# Patient Record
Sex: Female | Born: 1985 | Race: White | Hispanic: No | Marital: Married | Smoking: Never smoker
Health system: Southern US, Community
[De-identification: ages and names within clinical notes are randomized; demographics above are authoritative.]

## PROBLEM LIST (undated history)

## (undated) ENCOUNTER — Inpatient Hospital Stay (HOSPITAL_COMMUNITY): Payer: Self-pay

## (undated) DIAGNOSIS — J45909 Unspecified asthma, uncomplicated: Secondary | ICD-10-CM

## (undated) DIAGNOSIS — N83209 Unspecified ovarian cyst, unspecified side: Secondary | ICD-10-CM

## (undated) DIAGNOSIS — M25572 Pain in left ankle and joints of left foot: Secondary | ICD-10-CM

## (undated) DIAGNOSIS — G8929 Other chronic pain: Secondary | ICD-10-CM

## (undated) HISTORY — DX: Unspecified asthma, uncomplicated: J45.909

## (undated) HISTORY — PX: FIBULA FRACTURE SURGERY: SHX947

## (undated) HISTORY — PX: TIBIA FRACTURE SURGERY: SHX806

## (undated) HISTORY — PX: OTHER SURGICAL HISTORY: SHX169

## (undated) HISTORY — PX: KNEE SURGERY: SHX244

---

## 2012-02-16 ENCOUNTER — Encounter (HOSPITAL_BASED_OUTPATIENT_CLINIC_OR_DEPARTMENT_OTHER): Payer: Self-pay | Admitting: *Deleted

## 2012-02-16 ENCOUNTER — Inpatient Hospital Stay (HOSPITAL_BASED_OUTPATIENT_CLINIC_OR_DEPARTMENT_OTHER)
Admission: EM | Admit: 2012-02-16 | Discharge: 2012-02-17 | Disposition: A | Discharge status: Home / Self Care | Payer: BC Managed Care – PPO | Attending: Obstetrics & Gynecology | Admitting: Obstetrics & Gynecology

## 2012-02-16 DIAGNOSIS — N831 Corpus luteum cyst of ovary, unspecified side: Secondary | ICD-10-CM | POA: Insufficient documentation

## 2012-02-16 DIAGNOSIS — O00109 Unspecified tubal pregnancy without intrauterine pregnancy: Secondary | ICD-10-CM | POA: Insufficient documentation

## 2012-02-16 DIAGNOSIS — N838 Other noninflammatory disorders of ovary, fallopian tube and broad ligament: Secondary | ICD-10-CM | POA: Insufficient documentation

## 2012-02-16 DIAGNOSIS — Z3490 Encounter for supervision of normal pregnancy, unspecified, unspecified trimester: Secondary | ICD-10-CM

## 2012-02-16 DIAGNOSIS — R109 Unspecified abdominal pain: Secondary | ICD-10-CM | POA: Insufficient documentation

## 2012-02-16 DIAGNOSIS — O34599 Maternal care for other abnormalities of gravid uterus, unspecified trimester: Secondary | ICD-10-CM | POA: Insufficient documentation

## 2012-02-16 HISTORY — DX: Unspecified ovarian cyst, unspecified side: N83.209

## 2012-02-16 MED ORDER — ONDANSETRON HCL 4 MG/2ML IJ SOLN
4.0000 mg | Freq: Once | INTRAMUSCULAR | Status: AC
  Administered 2012-02-17: 4 mg via INTRAVENOUS
  Filled 2012-02-16: qty 2

## 2012-02-16 MED ORDER — FENTANYL CITRATE 0.05 MG/ML IJ SOLN
100.0000 ug | Freq: Once | INTRAMUSCULAR | Status: AC
  Administered 2012-02-17: 100 ug via INTRAVENOUS
  Filled 2012-02-16: qty 2

## 2012-02-16 NOTE — ED Provider Notes (Signed)
History  This chart was scribed for Janice Gaskins, MD by Shari Heritage, ED Scribe. The patient was seen in room MH05/MH05. Patient's care was started at 2341.  CSN: [id number]  Arrival date & time 02/16/12  2339   First MD Initiated Contact with Patient 02/16/12 2341      Chief Complaint  Patient presents with  . Abdominal Pain     Patient is a 27 y.o. female presenting with abdominal pain. The history is provided by the patient. No language interpreter was used.  Abdominal Pain The primary symptoms of the illness include abdominal pain and vomiting. The primary symptoms of the illness do not include fever, nausea, diarrhea or vaginal bleeding. The current episode started 1 to 2 hours ago. The onset of the illness was sudden. The problem has not changed since onset. The abdominal pain began 1 to 2 hours ago. The pain came on suddenly. The abdominal pain has been unchanged since its onset. The abdominal pain is located in the suprapubic region. The abdominal pain does not radiate. The abdominal pain is relieved by nothing. The abdominal pain is exacerbated by movement (certain movements).  The vomiting began today. Vomiting occurs 2 to 5 times per day. The emesis contains stomach contents.    HPI Comments: Janice Cooper is a 27 y.o. female who presents to the Emergency Department complaining of sudden onset, severe, constant, suprapubic abdominal pain that began 1-2 hours ago. Pain is non-radiating. Patient states that she was resting in bed when pain began. Associated symtpom includes 5 episodes of emesis. Patient says that pain worsened while driving over bumps in the road during ambulance ride to the ED. Patient denies nausea, diarrhea, fever or vaginal bleeding. Patient has a history of ovarian cyst rupture, but states that pain is not the same as current pain. She denies any other significant past medical or surgical abdominal history. Patient has no known allergies to pain  medicines.  Past Medical History  Diagnosis Date  . Ovarian cyst   . Ovarian cyst rupture     Past Surgical History  Procedure Date  . Knee surgery   . Tibia fracture surgery   . Fibula fracture surgery   . Thumb surgery     No family history on file.  History  Substance Use Topics  . Smoking status: Not on file  . Smokeless tobacco: Not on file  . Alcohol Use: Not on file    OB History    No data available      Review of Systems  Constitutional: Negative for fever.  Gastrointestinal: Positive for vomiting and abdominal pain. Negative for nausea and diarrhea.  Genitourinary: Negative for vaginal bleeding.  All other systems reviewed and are negative.    Allergies  Review of patient's allergies indicates not on file.  Home Medications  No current outpatient prescriptions on file.  Triage Vitals: BP 112/69  Pulse 89  Temp 98.6 F (37 C) (Oral)  Resp 22  Wt 175 lb (79.379 kg)  SpO2 99%  LMP 01/17/2012 BP 117/77  Pulse 89  Temp 98.6 F (37 C) (Oral)  Resp 18  Wt 175 lb (79.379 kg)  SpO2 99%  LMP 01/17/2012  Physical Exam CONSTITUTIONAL: Well developed/well nourished, ill appearing. HEAD AND FACE: Normocephalic/atraumatic EYES: EOMI/PERRL ENMT: Mucous membranes dry. NECK: supple no meningeal signs SPINE:entire spine nontender CV: S1/S2 noted, no murmurs/rubs/gallops noted LUNGS: Lungs are clear to auscultation bilaterally, no apparent distress ABDOMEN: soft, no rebound or guarding, diffuse tenderness with  localizing in suprapubic region, tenderness is severe. GU:no cva tenderness No cmt.  No vag bleeding.  Diffuse adnexal tenderness noted.  Chaperone present NEURO: Pt is awake/alert, moves all extremitiesx4 EXTREMITIES: pulses normal, full ROM SKIN: warm, color normal PSYCH: no abnormalities of mood noted  ED Course  Procedures  FAST BEDSIDE US Indication: abdominal pain, suspected ruptured ectopic pregnancy  3 Views obtained:  Splenorenal, Morrison's Pouch, Retrovesical + free fluid in abdomen  Archived electronically I personally performed and interrepreted the images  CRITICAL CARE Performed by: Janice Cooper   Total critical care time: 36  Critical care time was exclusive of separately billable procedures and treating other patients.  Critical care was necessary to treat or prevent imminent or life-threatening deterioration.  Critical care was time spent personally by me on the following activities: development of treatment plan with patient and/or surrogate as well as nursing, discussions with consultants, evaluation of patient's response to treatment, examination of patient, obtaining history from patient or surrogate, ordering and performing treatments and interventions, ordering and review of laboratory studies, ordering and review of radiographic studies, pulse oximetry and re-evaluation of patient's condition.  DIAGNOSTIC STUDIES: Oxygen Saturation is 99% on room air, normal by my interpretation.    COORDINATION OF CARE: 11:44 PM- Patient informed of current plan for treatment and evaluation and agrees with plan at this time.   12:56 AM Pt found to be pregnant She reports LMP about 32 days ago.  She has never been pregnant but has had fertility issues in the past.  She is not on any current hormonal therapy.  She did not know she was pregnant.  She has no h/o gynecologic surgery.   Given nature of abrupt onset of pain with diffuse tenderness, I am concerned for ruptured ectopic pregnancy.  On ultrasound, I am unable to identify pregnancy transabdominally. However on FAST exam she does have free fluid in RUQ.  She continues to report abdominal pain.   [facility].   Pt has been given IV fluids.  She has been NPO since around 9pm.    1:01 AM HCG Quant is low.  Still concerned for ruptured ectopic.  However, ruptured appendicitis is  not completely excluded.  However given that she has significant lower abdominal pain, she is pregnant and possible Free fluid in abdomen, she will require transfer to higher level of care that includes gynecology.       MDM  Nursing notes including past medical history and social history reviewed and considered in documentation Labs/vital reviewed and considered       I personally performed the services described in this documentation, which was scribed in my presence. The recorded information has been reviewed and is accurate.     Janice Gaskins, MD 02/17/12 [phone]

## 2012-02-16 NOTE — ED Notes (Signed)
Pt c/o sudden lower/middle abdominal pain that began at apprx. 2200hrs. Pt describes a pressure type pain that waxes and wanes but never actually goes away. Pt reports 4-5 episodes of vomiting that she believes is from the pain. Pt denies difficulty urinating or vaginal discharge.

## 2012-02-17 ENCOUNTER — Inpatient Hospital Stay (HOSPITAL_COMMUNITY): Payer: BC Managed Care – PPO | Admitting: Anesthesiology

## 2012-02-17 ENCOUNTER — Encounter (HOSPITAL_COMMUNITY): Admission: EM | Disposition: A | Discharge status: Home / Self Care | Payer: Self-pay | Source: Home / Self Care

## 2012-02-17 ENCOUNTER — Encounter (HOSPITAL_COMMUNITY): Payer: Self-pay | Admitting: Anesthesiology

## 2012-02-17 ENCOUNTER — Inpatient Hospital Stay (HOSPITAL_COMMUNITY): Payer: BC Managed Care – PPO

## 2012-02-17 ENCOUNTER — Encounter (HOSPITAL_COMMUNITY): Payer: Self-pay

## 2012-02-17 DIAGNOSIS — O00109 Unspecified tubal pregnancy without intrauterine pregnancy: Secondary | ICD-10-CM

## 2012-02-17 DIAGNOSIS — R109 Unspecified abdominal pain: Secondary | ICD-10-CM

## 2012-02-17 HISTORY — PX: LAPAROSCOPY: SHX197

## 2012-02-17 LAB — URINALYSIS, ROUTINE W REFLEX MICROSCOPIC
Bilirubin Urine: NEGATIVE
Glucose, UA: NEGATIVE mg/dL
Hgb urine dipstick: NEGATIVE
Ketones, ur: 15 mg/dL — AB
Specific Gravity, Urine: 1.012 (ref 1.005–1.030)
pH: 7 (ref 5.0–8.0)

## 2012-02-17 LAB — CBC WITH DIFFERENTIAL/PLATELET
Basophils Absolute: 0 10*3/uL (ref 0.0–0.1)
Eosinophils Relative: 1 % (ref 0–5)
HCT: 40.1 % (ref 36.0–46.0)
Hemoglobin: 13.7 g/dL (ref 12.0–15.0)
Lymphocytes Relative: 18 % (ref 12–46)
Lymphs Abs: 2 10*3/uL (ref 0.7–4.0)
MCV: 92 fL (ref 78.0–100.0)
Monocytes Absolute: 0.7 10*3/uL (ref 0.1–1.0)
Monocytes Relative: 6 % (ref 3–12)
Neutro Abs: 8.1 10*3/uL — ABNORMAL HIGH (ref 1.7–7.7)
RDW: 12.2 % (ref 11.5–15.5)
WBC: 10.8 10*3/uL — ABNORMAL HIGH (ref 4.0–10.5)

## 2012-02-17 LAB — WET PREP, GENITAL
Clue Cells Wet Prep HPF POC: NONE SEEN
Trich, Wet Prep: NONE SEEN
WBC, Wet Prep HPF POC: NONE SEEN
Yeast Wet Prep HPF POC: NONE SEEN

## 2012-02-17 LAB — BASIC METABOLIC PANEL
CO2: 21 mEq/L (ref 19–32)
Chloride: 104 mEq/L (ref 96–112)
Creatinine, Ser: 0.6 mg/dL (ref 0.50–1.10)
Potassium: 4.1 mEq/L (ref 3.5–5.1)

## 2012-02-17 LAB — HCG, QUANTITATIVE, PREGNANCY: hCG, Beta Chain, Quant, S: 249 m[IU]/mL — ABNORMAL HIGH (ref <5)

## 2012-02-17 LAB — PROGESTERONE: Progesterone: 8.2 ng/mL

## 2012-02-17 SURGERY — LAPAROSCOPY OPERATIVE
Anesthesia: General | Site: Abdomen | Wound class: Clean Contaminated

## 2012-02-17 MED ORDER — FENTANYL CITRATE 0.05 MG/ML IJ SOLN
25.0000 ug | INTRAMUSCULAR | Status: DC | PRN
  Administered 2012-02-17: 25 ug via INTRAVENOUS

## 2012-02-17 MED ORDER — SODIUM CHLORIDE 0.9 % IV BOLUS (SEPSIS)
1000.0000 mL | Freq: Once | INTRAVENOUS | Status: AC
  Administered 2012-02-17: 1000 mL via INTRAVENOUS

## 2012-02-17 MED ORDER — SUCCINYLCHOLINE CHLORIDE 20 MG/ML IJ SOLN
INTRAMUSCULAR | Status: DC | PRN
  Administered 2012-02-17: 140 mg via INTRAVENOUS

## 2012-02-17 MED ORDER — DEXAMETHASONE SODIUM PHOSPHATE 10 MG/ML IJ SOLN
INTRAMUSCULAR | Status: DC | PRN
  Administered 2012-02-17: 10 mg via INTRAVENOUS

## 2012-02-17 MED ORDER — FENTANYL CITRATE 0.05 MG/ML IJ SOLN
50.0000 ug | Freq: Once | INTRAMUSCULAR | Status: AC
  Administered 2012-02-17: 50 ug via INTRAVENOUS
  Filled 2012-02-17: qty 2

## 2012-02-17 MED ORDER — CITRIC ACID-SODIUM CITRATE 334-500 MG/5ML PO SOLN
30.0000 mL | Freq: Once | ORAL | Status: AC
  Administered 2012-02-17: 30 mL via ORAL
  Filled 2012-02-17: qty 15

## 2012-02-17 MED ORDER — NEOSTIGMINE METHYLSULFATE 1 MG/ML IJ SOLN
INTRAMUSCULAR | Status: DC | PRN
  Administered 2012-02-17: 2 mg via INTRAVENOUS

## 2012-02-17 MED ORDER — FAMOTIDINE IN NACL 20-0.9 MG/50ML-% IV SOLN
20.0000 mg | Freq: Once | INTRAVENOUS | Status: AC
  Administered 2012-02-17: 20 mg via INTRAVENOUS
  Filled 2012-02-17: qty 50

## 2012-02-17 MED ORDER — MIDAZOLAM HCL 5 MG/5ML IJ SOLN
INTRAMUSCULAR | Status: DC | PRN
  Administered 2012-02-17: 2 mg via INTRAVENOUS

## 2012-02-17 MED ORDER — LACTATED RINGERS IV SOLN
INTRAVENOUS | Status: DC | PRN
  Administered 2012-02-17 (×2): via INTRAVENOUS

## 2012-02-17 MED ORDER — PROGESTERONE 8 % VA GEL: 1.0000 | Freq: Two times a day (BID) | VAGINAL | Status: DC

## 2012-02-17 MED ORDER — ROCURONIUM BROMIDE 100 MG/10ML IV SOLN
INTRAVENOUS | Status: DC | PRN
  Administered 2012-02-17: 20 mg via INTRAVENOUS
  Administered 2012-02-17: 10 mg via INTRAVENOUS

## 2012-02-17 MED ORDER — LIDOCAINE HCL (CARDIAC) 20 MG/ML IV SOLN
INTRAVENOUS | Status: DC | PRN
  Administered 2012-02-17: 60 mg via INTRAVENOUS

## 2012-02-17 MED ORDER — BUPIVACAINE HCL (PF) 0.25 % IJ SOLN
INTRAMUSCULAR | Status: DC | PRN
  Administered 2012-02-17: 3 mL

## 2012-02-17 MED ORDER — KETOROLAC TROMETHAMINE 30 MG/ML IJ SOLN
15.0000 mg | Freq: Once | INTRAMUSCULAR | Status: AC | PRN
  Administered 2012-02-17: 30 mg via INTRAVENOUS

## 2012-02-17 MED ORDER — LACTATED RINGERS IV SOLN
INTRAVENOUS | Status: DC
  Administered 2012-02-17: 125 mL/h via INTRAVENOUS

## 2012-02-17 MED ORDER — GLYCOPYRROLATE 0.2 MG/ML IJ SOLN
INTRAMUSCULAR | Status: DC | PRN
  Administered 2012-02-17: 0.1 mg via INTRAVENOUS
  Administered 2012-02-17: 0.4 mg via INTRAVENOUS

## 2012-02-17 MED ORDER — METOCLOPRAMIDE HCL 5 MG/ML IJ SOLN
10.0000 mg | Freq: Once | INTRAMUSCULAR | Status: AC
  Administered 2012-02-17: 10 mg via INTRAVENOUS
  Filled 2012-02-17: qty 2

## 2012-02-17 MED ORDER — PROPOFOL 10 MG/ML IV BOLUS
INTRAVENOUS | Status: DC | PRN
  Administered 2012-02-17: 30 mg via INTRAVENOUS
  Administered 2012-02-17: 170 mg via INTRAVENOUS

## 2012-02-17 MED ORDER — FENTANYL CITRATE 0.05 MG/ML IJ SOLN
INTRAMUSCULAR | Status: DC | PRN
  Administered 2012-02-17: 150 ug via INTRAVENOUS
  Administered 2012-02-17 (×3): 100 ug via INTRAVENOUS

## 2012-02-17 MED ORDER — LACTATED RINGERS IR SOLN
Status: DC | PRN
  Administered 2012-02-17: 3000 mL

## 2012-02-17 MED ORDER — ONDANSETRON HCL 4 MG/2ML IJ SOLN
INTRAMUSCULAR | Status: DC | PRN
  Administered 2012-02-17: 4 mg via INTRAVENOUS

## 2012-02-17 MED ORDER — PROGESTERONE 50 MG/ML IM OIL
50.0000 mg | TOPICAL_OIL | Freq: Once | INTRAMUSCULAR | Status: AC
  Administered 2012-02-17: 50 mg via INTRAMUSCULAR
  Filled 2012-02-17 (×2): qty 10

## 2012-02-17 MED ORDER — OXYCODONE-ACETAMINOPHEN 5-325 MG PO TABS: 1.0000 | ORAL_TABLET | ORAL | Status: DC | PRN

## 2012-02-17 SURGICAL SUPPLY — 28 items
APPLICATOR COTTON TIP 6IN STRL (MISCELLANEOUS) ×2 IMPLANT
BLADE SURG 15 STRL LF C SS BP (BLADE) ×1 IMPLANT
BLADE SURG 15 STRL SS (BLADE) ×1
CHLORAPREP W/TINT 26ML (MISCELLANEOUS) ×2 IMPLANT
CLOTH BEACON ORANGE TIMEOUT ST (SAFETY) ×2 IMPLANT
DERMABOND ADVANCED (GAUZE/BANDAGES/DRESSINGS) ×3
DERMABOND ADVANCED .7 DNX12 (GAUZE/BANDAGES/DRESSINGS) ×3 IMPLANT
GLOVE BIO SURGEON STRL SZ7 (GLOVE) ×2 IMPLANT
GLOVE BIOGEL PI IND STRL 7.0 (GLOVE) ×2 IMPLANT
GLOVE BIOGEL PI INDICATOR 7.0 (GLOVE) ×2
GOWN PREVENTION PLUS LG XLONG (DISPOSABLE) ×4 IMPLANT
NEEDLE INSUFFLATION 14GA 120MM (NEEDLE) ×2 IMPLANT
NS IRRIG 1000ML POUR BTL (IV SOLUTION) ×2 IMPLANT
PACK LAPAROSCOPY BASIN (CUSTOM PROCEDURE TRAY) ×2 IMPLANT
POUCH SPECIMEN RETRIEVAL 10MM (ENDOMECHANICALS) ×2 IMPLANT
PROTECTOR NERVE ULNAR (MISCELLANEOUS) ×2 IMPLANT
SCALPEL HARMONIC ACE (MISCELLANEOUS) IMPLANT
SET IRRIG TUBING LAPAROSCOPIC (IRRIGATION / IRRIGATOR) ×2 IMPLANT
SLEEVE SCD COMPRESS KNEE MED (MISCELLANEOUS) ×2 IMPLANT
SUT VICRYL 0 ENDOLOOP (SUTURE) IMPLANT
SUT VICRYL 0 UR6 27IN ABS (SUTURE) ×2 IMPLANT
SUT VICRYL 4-0 PS2 18IN ABS (SUTURE) ×4 IMPLANT
TOWEL OR 17X24 6PK STRL BLUE (TOWEL DISPOSABLE) ×4 IMPLANT
TRAY FOLEY CATH 14FR (SET/KITS/TRAYS/PACK) ×2 IMPLANT
TROCAR BALLN 12MMX100 BLUNT (TROCAR) IMPLANT
TROCAR XCEL NON-BLD 11X100MML (ENDOMECHANICALS) ×4 IMPLANT
TROCAR XCEL NON-BLD 5MMX100MML (ENDOMECHANICALS) ×6 IMPLANT
WATER STERILE IRR 1000ML POUR (IV SOLUTION) IMPLANT

## 2012-02-17 NOTE — Op Note (Signed)
Janice Cooper 02/16/2012 - 02/17/2012  PREOPERATIVE DIAGNOSIS:  27 yo female early pregnant and hemoperitoneum   POSTOPERATIVE DIAGNOSIS:  27 yo female early pregnant with ruptured bleeding corpus luteum and pelvic mass  PROCEDURE:  Diagnostic laparoscopy, left salpingectomy, left ovarian cystectomy, cautery of the let ovary  ANESTHESIA:  General endotracheal  COMPLICATIONS:  None immediate.  ESTIMATED BLOOD LOSS:  150 cc in pelvis at time of entry and 150 cc blood loss during case  INDICATIONS: 27 y.o.  with suspected ectopic pregnancy.  Pt has an acute abdomen and hemoperitoneum.      FINDINGS:  Normal uterus, normal right fallopian tube, left fallopian tube oozing at attachment to ovary, left ruptured ovarian cyst with active bleed.  TECHNIQUE:  The patient was taken to the operating room where general anesthesia was obtained without difficulty.  She was then placed in the dorsal lithotomy position and prepared and draped in sterile fashion.  After an adequate timeout was performed, a bivalved speculum was then placed in the patient's vagina, and the anterior lip of cervix grasped with the single-tooth tenaculum.  The hulka clip was advanced into the uterus.  The speculum was removed from the vagina.  Attention was then turned to the patient's abdomen where a 10-mm skin incision was made on the umbilical fold.  The Veress needle was carefully introduced into the peritoneal cavity through the abdominal wall.  Intraperitoneal placement was confirmed by drop in intraabdominal pressure with insufflation of carbon dioxide gas.  Adequate pneumoperitoneum was obtained, and the 10/11 XL trocar was then advanced without difficulty into the abdomen where intraabdominal placement was confirmed by the operative laparoscope. One 5 mm port was place in the left lower quadrant and a 10/11 XL trocar was placed in the right lower quadrant under direct visualization.  A 2 cm mass was noted in the posterior cul  de sac which could not be irrigated.  It was indeterminate if the mass was a clot or an ectopic pregnancy that was expelled from oozing left fallopian tube.  The Endopouch was used to remove the mass and the mass was sent to pathology.  The left ovary had a 2 cm cyst which continued to bleed.  The left fallopian tube was also still bleeding.  At this time a second 5 mm port was plaed in the left lower quadrant to aid in retraction of the the left adnexa.  A left salpingectomy was performed using the Harmonic scalpel.  The ovarian cyst was drained and the fluid was noted to be clear.   A portion of the cyst wall was also removed.  The ovary was cauterized with the Kleppinger which controlled the bleeding. The pneumoperitoneum was released and the ovary was allowed to rest in the pelvis off tension for 5 minutes.  The pneumoperitoneum was re-obtained and the left ovary was noted to be hemostatic.  The pneumoperitoneum was released and the ports were removed.  The right lower quadrant fascia was closed with 0 Vicryl.  The other fascia incisions were less than 1 cm and did not need closure.  Skin at the umbilicus and right lower quadrant was closed with 4-0 Vicryl in a subcuticular fashion.  The 2 left lower quadrants were re-approximated with Derma bond.  The tenaculum was removed from the uterus.  The uterine manipulator and the tenaculum were removed from the vagina without complications. The patient tolerated the procedure well.  Sponge, lap, and needle counts were correct times two.  The patient was then  taken to the recovery room awake, extubated and in stable  in stable condition.

## 2012-02-17 NOTE — Anesthesia Postprocedure Evaluation (Signed)
Anesthesia Post Note  Patient: Janice Cooper  Procedure(s) Performed: Procedure(s) (LRB): LAPAROSCOPY OPERATIVE (N/A)  Anesthesia type: General  Patient location: PACU  Post pain: Pain level controlled  Post assessment: Post-op Vital signs reviewed  Last Vitals:  Filed Vitals:   02/17/12 0800  BP: 95/55  Pulse: 89  Temp:   Resp: 16    Post vital signs: Reviewed  Level of consciousness: sedated  Complications: No apparent anesthesia complications

## 2012-02-17 NOTE — ED Notes (Signed)
MD at bedside. 

## 2012-02-17 NOTE — Transfer of Care (Signed)
Immediate Anesthesia Transfer of Care Note  Patient: Janice Cooper  Procedure(s) Performed: Procedure(s) (LRB) with comments: LAPAROSCOPY OPERATIVE (N/A) - Operative Laparoscopy, Left Salpingectomy, Cystectomy  Patient Location: PACU  Anesthesia Type:General  Level of Consciousness: sedated  Airway & Oxygen Therapy: Patient Spontanous Breathing and Patient connected to nasal cannula oxygen  Post-op Assessment: Report given to PACU RN and Post -op Vital signs reviewed and stable  Post vital signs: stable  Complications: No apparent anesthesia complications

## 2012-02-17 NOTE — Anesthesia Preprocedure Evaluation (Signed)
Anesthesia Evaluation  Patient identified by MRN, date of birth, ID band Patient awake    Reviewed: Allergy & Precautions, H&P , NPO status , Patient's Chart, lab work & pertinent test results, reviewed documented beta blocker date and time   History of Anesthesia Complications Negative for: history of anesthetic complications  Airway Mallampati: III TM Distance: >3 FB Neck ROM: full    Dental  (+) Teeth Intact   Pulmonary neg pulmonary ROS,  breath sounds clear to auscultation  Pulmonary exam normal       Cardiovascular Rhythm:regular Rate:Normal     Neuro/Psych negative neurological ROS  negative psych ROS   GI/Hepatic negative GI ROS, Neg liver ROS,   Endo/Other  negative endocrine ROS  Renal/GU      Musculoskeletal   Abdominal   Peds  Hematology negative hematology ROS (+)   Anesthesia Other Findings Ate at 9 pm, but had episode of vomiting at 10 pm Gave orders for pepcid, reglan and bicitra to be given prior to OR  Reproductive/Obstetrics (+) Pregnancy (ruptured ectopic pregnancy)                           Anesthesia Physical Anesthesia Plan  ASA: II and emergent  Anesthesia Plan: General ETT and Rapid Sequence   Post-op Pain Management:    Induction:   Airway Management Planned:   Additional Equipment:   Intra-op Plan:   Post-operative Plan:   Informed Consent: I have reviewed the patients History and Physical, chart, labs and discussed the procedure including the risks, benefits and alternatives for the proposed anesthesia with the patient or authorized representative who has indicated his/her understanding and acceptance.   Dental Advisory Given  Plan Discussed with: CRNA and Surgeon  Anesthesia Plan Comments:         Anesthesia Quick Evaluation

## 2012-02-17 NOTE — MAU Note (Signed)
Pt states she was eating dinner around 10 pm when the pain started. States that it was sharp and got progressivley worse. States that she vomited x4 at home because of the pain.

## 2012-02-17 NOTE — ED Notes (Signed)
In and out cath performed for urine collection, pt tolerated well.

## 2012-02-17 NOTE — MAU Provider Note (Addendum)
History     CSN: [id number]  Arrival date and time: 02/16/12 2339   None     Chief Complaint  Patient presents with  . Abdominal Pain   HPI Janice Cooper is a 27 y.o. female who presents to MAU with abdominal pain. She was evaluated @ [facility] and transferred her for possible ectopic pregnancy. The pain started approximately 11 pm while lying in bed. The onset was sudden. The pain is located in the suprapubic area. Associated symptoms include nausea and vomiting. Nothing make the pain better. The pain is worse with certain movements. The patient rates the pain as 10/10. The patient had labs and pelvic exam done at [facility].  OB History    Grav Para Term Preterm Abortions TAB SAB Ect Mult Living   1               Past Medical History  Diagnosis Date  . Ovarian cyst   . Ovarian cyst rupture     Past Surgical History  Procedure Date  . Knee surgery   . Tibia fracture surgery   . Fibula fracture surgery   . Thumb surgery     Family History  Problem Relation Age of Onset  . Cancer Mother   . Heart disease Mother   . Hypertension Mother   . Hyperlipidemia Father     History  Substance Use Topics  . Smoking status: Never Smoker   . Smokeless tobacco: Not on file  . Alcohol Use: Yes    Allergies:  Allergies  Allergen Reactions  . Ciprofloxacin Anaphylaxis  . Clindamycin/Lincomycin Anaphylaxis  . Neomycin Anaphylaxis  . Penicillins Anaphylaxis  . Polysporin (Bacitracin-Polymyxin B) Anaphylaxis  . Vancomycin Anaphylaxis    No prescriptions prior to admission    Review of Systems  Constitutional: Negative for fever and chills.  Eyes: Negative for blurred vision and double vision.  Respiratory: Negative for cough and wheezing.   Gastrointestinal: Positive for nausea, vomiting and abdominal pain.  Genitourinary: Positive for frequency. Negative for dysuria and urgency.  Skin: Negative for rash.  Neurological: Negative for headaches.  Psychiatric/Behavioral:  Negative for depression. The patient is not nervous/anxious.    Physical Exam   Blood pressure 116/65, pulse 82, temperature 97.8 F (36.6 C), temperature source Oral, resp. rate 18, weight 175 lb (79.379 kg), last menstrual period 01/17/2012, SpO2 100.00%.  Physical Exam  Nursing note and vitals reviewed. Constitutional: She is oriented to person, place, and time. She appears well-developed and well-nourished. No distress.       Uncomfortable appearing.  HENT:  Head: Normocephalic and atraumatic.  Eyes: EOM are normal.  Neck: Neck supple.  Cardiovascular: Normal rate.   Respiratory: Effort normal.  GI: Soft. There is tenderness in the right lower quadrant, suprapubic area and left lower quadrant. There is guarding.  Musculoskeletal: Normal range of motion.  Neurological: She is alert and oriented to person, place, and time.  Skin: There is pallor.  Psychiatric: She has a normal mood and affect. Her behavior is normal. Judgment and thought content normal.  Lungs:  CTAB CV:  Normal rate dn rhythm, no murmur  Results for orders placed during the hospital encounter of 02/16/12 (from the past 24 hour(s))  BASIC METABOLIC PANEL     Status: Abnormal   Collection Time   02/17/12 12:16 AM      Component Value Range   Sodium 140  135 - 145 mEq/L   Potassium 4.1  3.5 - 5.1 mEq/L   Chloride 104  96 - 112 mEq/L   CO2 21  19 - 32 mEq/L   Glucose, Bld 122 (*) 70 - 99 mg/dL   BUN 7  6 - 23 mg/dL   Creatinine, Ser 1.61  0.50 - 1.10 mg/dL   Calcium 9.1  8.4 - 09.6 mg/dL   GFR calc non Af Amer >90  >90 mL/min   GFR calc Af Amer >90  >90 mL/min  CBC WITH DIFFERENTIAL     Status: Abnormal   Collection Time   02/17/12 12:16 AM      Component Value Range   WBC 10.8 (*) 4.0 - 10.5 K/uL   RBC 4.36  3.87 - 5.11 MIL/uL   Hemoglobin 13.7  12.0 - 15.0 g/dL   HCT 04.5  40.9 - 81.1 %   MCV 92.0  78.0 - 100.0 fL   MCH 31.4  26.0 - 34.0 pg   MCHC 34.2  30.0 - 36.0 g/dL   RDW 91.4  78.2 - 95.6 %    Platelets 172  150 - 400 K/uL   Neutrophils Relative 75  43 - 77 %   Neutro Abs 8.1 (*) 1.7 - 7.7 K/uL   Lymphocytes Relative 18  12 - 46 %   Lymphs Abs 2.0  0.7 - 4.0 K/uL   Monocytes Relative 6  3 - 12 %   Monocytes Absolute 0.7  0.1 - 1.0 K/uL   Eosinophils Relative 1  0 - 5 %   Eosinophils Absolute 0.1  0.0 - 0.7 K/uL   Basophils Relative 0  0 - 1 %   Basophils Absolute 0.0  0.0 - 0.1 K/uL  HCG, QUANTITATIVE, PREGNANCY     Status: Abnormal   Collection Time   02/17/12 12:16 AM      Component Value Range   hCG, Beta Chain, Quant, S 249 (*) <5 mIU/mL  URINALYSIS, ROUTINE W REFLEX MICROSCOPIC     Status: Abnormal   Collection Time   02/17/12 12:18 AM      Component Value Range   Color, Urine YELLOW  YELLOW   APPearance CLEAR  CLEAR   Specific Gravity, Urine 1.012  1.005 - 1.030   pH 7.0  5.0 - 8.0   Glucose, UA NEGATIVE  NEGATIVE mg/dL   Hgb urine dipstick NEGATIVE  NEGATIVE   Bilirubin Urine NEGATIVE  NEGATIVE   Ketones, ur 15 (*) NEGATIVE mg/dL   Protein, ur NEGATIVE  NEGATIVE mg/dL   Urobilinogen, UA 0.2  0.0 - 1.0 mg/dL   Nitrite NEGATIVE  NEGATIVE   Leukocytes, UA NEGATIVE  NEGATIVE  PREGNANCY, URINE     Status: Abnormal   Collection Time   02/17/12 12:18 AM      Component Value Range   Preg Test, Ur POSITIVE (*) NEGATIVE  WET PREP, GENITAL     Status: Normal   Collection Time   02/17/12 12:44 AM      Component Value Range   Yeast Wet Prep HPF POC NONE SEEN  NONE SEEN   Trich, Wet Prep NONE SEEN  NONE SEEN   Clue Cells Wet Prep HPF POC NONE SEEN  NONE SEEN   WBC, Wet Prep HPF POC NONE SEEN  NONE SEEN    Procedures US Ob Comp Less 14 Wks  02/17/2012  *RADIOLOGY REPORT*  Clinical Data: Abdominal pain, early pregnancy.  OBSTETRIC <14 WK [place] AND TRANSVAGINAL OB US  Technique:  Both transabdominal and transvaginal ultrasound examinations were performed for complete evaluation of the gestation as well as  the maternal uterus, adnexal regions, and pelvic cul-de-sac.   Transvaginal technique was performed to assess early pregnancy.  Comparison:  None.  Intrauterine gestational sac:  Not identified  Maternal uterus/adnexae: Normal sonographic appearance to the right ovary.  2.4 x 2.1 x 2.1 cm left ovarian cyst is noted.  There is a moderate amount of complex fluid/blood clot within the pelvis.  IMPRESSION: No intrauterine pregnancy identified at this time.  There is a moderate amount of complex fluid/blood within the cul-de-sac.  Favored differential includes early pregnancy (below the threshold for ultrasound) with cyst rupture versus ectopic pregnancy. Recommend serial beta HCG and close patient observation/short-term ultrasound follow-up as warranted.   Original Report Authenticated By: Jearld Lesch, M.D.    US Ob Transvaginal  02/17/2012  *RADIOLOGY REPORT*  Clinical Data: Abdominal pain, early pregnancy.  OBSTETRIC <14 WK [place] AND TRANSVAGINAL OB US  Technique:  Both transabdominal and transvaginal ultrasound examinations were performed for complete evaluation of the gestation as well as the maternal uterus, adnexal regions, and pelvic cul-de-sac.  Transvaginal technique was performed to assess early pregnancy.  Comparison:  None.  Intrauterine gestational sac:  Not identified  Maternal uterus/adnexae: Normal sonographic appearance to the right ovary.  2.4 x 2.1 x 2.1 cm left ovarian cyst is noted.  There is a moderate amount of complex fluid/blood clot within the pelvis.  IMPRESSION: No intrauterine pregnancy identified at this time.  There is a moderate amount of complex fluid/blood within the cul-de-sac.  Favored differential includes early pregnancy (below the threshold for ultrasound) with cyst rupture versus ectopic pregnancy. Recommend serial beta HCG and close patient observation/short-term ultrasound follow-up as warranted.   Original Report Authenticated By: Jearld Lesch, M.D.     Assessment: 27 y.o. female at approximately [redacted] weeks gestation with abdominal  pain   Complex fluid within the pelvis   No IUP  Plan:  Discussed with Dr. Penne Lash and she will evaluate the patient in MAU NEESE,HOPE, RN, FNP, [facility] 02/17/2012, 3:20 AM   Pt seen and examined.  Discussed case with radiologist.  Pt is extremely tender with rebound and guarding.  Radiologist concludes there is complex fluid in the pelvis.  No ectopic pregnancy is seen.  Pt could have a ruptured ovarian cyst.  Given the peritoneal signs, it would be safest to proceed with diagnostic laparoscopy. Pt consented for Laparoscopy, possible salpingectomy, and removal of ectopic pregnancy. Risks include but not limited to bleeding, infection, damage to intrabdominal organs, complications from anesthesia. OR notified and ready for OR from physician standpoint.

## 2012-02-17 NOTE — ED Notes (Signed)
Pt report given to [employer], Charity fundraiser with [facility].

## 2012-02-18 ENCOUNTER — Encounter (HOSPITAL_COMMUNITY): Payer: Self-pay | Admitting: Obstetrics & Gynecology

## 2012-02-18 LAB — GC/CHLAMYDIA PROBE AMP: CT Probe RNA: NEGATIVE

## 2012-02-19 ENCOUNTER — Other Ambulatory Visit: Payer: Self-pay

## 2012-02-19 ENCOUNTER — Other Ambulatory Visit (INDEPENDENT_AMBULATORY_CARE_PROVIDER_SITE_OTHER): Payer: BC Managed Care – PPO

## 2012-02-19 DIAGNOSIS — O009 Unspecified ectopic pregnancy without intrauterine pregnancy: Secondary | ICD-10-CM

## 2012-02-19 DIAGNOSIS — IMO0002 Reserved for concepts with insufficient information to code with codable children: Secondary | ICD-10-CM

## 2012-02-20 ENCOUNTER — Telehealth: Payer: Self-pay | Admitting: *Deleted

## 2012-02-20 DIAGNOSIS — O2690 Pregnancy related conditions, unspecified, unspecified trimester: Secondary | ICD-10-CM

## 2012-02-20 MED ORDER — PROGESTERONE 8 % VA GEL: 1.0000 | Freq: Three times a day (TID) | VAGINAL | Status: DC

## 2012-02-20 NOTE — Telephone Encounter (Signed)
Pt notified of lab results and spoke with Dr Penne Lash who increased her Crinone to TID and pt to return for repeat labs on Monday 01/23/12.

## 2012-02-22 NOTE — H&P (Signed)
CSN: [id number]  Arrival date and time: 02/16/12 2339  None  Chief Complaint   Patient presents with   .  Abdominal Pain   HPI Janice Cooper is a 27 y.o. female who presents to MAU with abdominal pain. She was evaluated @ [facility] and transferred her for possible ectopic pregnancy. The pain started approximately 11 pm while lying in bed. The onset was sudden. The pain is located in the suprapubic area. Associated symptoms include nausea and vomiting. Nothing make the pain better. The pain is worse with certain movements. The patient rates the pain as 10/10. The patient had labs and pelvic exam done at [facility].  OB History    Grav  Para  Term  Preterm  Abortions  TAB  SAB  Ect  Mult  Living    1               Past Medical History   Diagnosis  Date   .  Ovarian cyst    .  Ovarian cyst rupture     Past Surgical History   Procedure  Date   .  Knee surgery    .  Tibia fracture surgery    .  Fibula fracture surgery    .  Thumb surgery     Family History   Problem  Relation  Age of Onset   .  Cancer  Mother    .  Heart disease  Mother    .  Hypertension  Mother    .  Hyperlipidemia  Father     History   Substance Use Topics   .  Smoking status:  Never Smoker   .  Smokeless tobacco:  Not on file   .  Alcohol Use:  Yes   Allergies:  Allergies   Allergen  Reactions   .  Ciprofloxacin  Anaphylaxis   .  Clindamycin/Lincomycin  Anaphylaxis   .  Neomycin  Anaphylaxis   .  Penicillins  Anaphylaxis   .  Polysporin (Bacitracin-Polymyxin B)  Anaphylaxis   .  Vancomycin  Anaphylaxis    No prescriptions prior to admission   Review of Systems  Constitutional: Negative for fever and chills.  Eyes: Negative for blurred vision and double vision.  Respiratory: Negative for cough and wheezing.  Gastrointestinal: Positive for nausea, vomiting and abdominal pain.  Genitourinary: Positive for frequency. Negative for dysuria and urgency.  Skin: Negative for rash.  Neurological: Negative for headaches.   Psychiatric/Behavioral: Negative for depression. The patient is not nervous/anxious.  Physical Exam   Blood pressure 116/65, pulse 82, temperature 97.8 F (36.6 C), temperature source Oral, resp. rate 18, weight 175 lb (79.379 kg), last menstrual period 01/17/2012, SpO2 100.00%.  Physical Exam  Nursing note and vitals reviewed.  Constitutional: She is oriented to person, place, and time. She appears well-developed and well-nourished. No distress.  Uncomfortable appearing.  HENT:  Head: Normocephalic and atraumatic.  Eyes: EOM are normal.  Neck: Neck supple.  Cardiovascular: Normal rate.  Respiratory: Effort normal.  GI: Soft. There is tenderness in the right lower quadrant, suprapubic area and left lower quadrant. There is guarding.  Musculoskeletal: Normal range of motion.  Neurological: She is alert and oriented to person, place, and time.  Skin: There is pallor.  Psychiatric: She has a normal mood and affect. Her behavior is normal. Judgment and thought content normal.  Lungs: CTAB  CV: Normal rate dn rhythm, no murmur  Results for orders placed during the hospital encounter of 02/16/12 (from  the past 24 hour(s))   BASIC METABOLIC PANEL Status: Abnormal    Collection Time    02/17/12 12:16 AM   Component  Value  Range    Sodium  140  135 - 145 mEq/L    Potassium  4.1  3.5 - 5.1 mEq/L    Chloride  104  96 - 112 mEq/L    CO2  21  19 - 32 mEq/L    Glucose, Bld  122 (*)  70 - 99 mg/dL    BUN  7  6 - 23 mg/dL    Creatinine, Ser  4.78  0.50 - 1.10 mg/dL    Calcium  9.1  8.4 - 10.5 mg/dL    GFR calc non Af Amer  >90  >90 mL/min    GFR calc Af Amer  >90  >90 mL/min   CBC WITH DIFFERENTIAL Status: Abnormal    Collection Time    02/17/12 12:16 AM   Component  Value  Range    WBC  10.8 (*)  4.0 - 10.5 K/uL    RBC  4.36  3.87 - 5.11 MIL/uL    Hemoglobin  13.7  12.0 - 15.0 g/dL    HCT  29.5  62.1 - 30.8 %    MCV  92.0  78.0 - 100.0 fL    MCH  31.4  26.0 - 34.0 pg    MCHC  34.2   30.0 - 36.0 g/dL    RDW  65.7  84.6 - 96.2 %    Platelets  172  150 - 400 K/uL    Neutrophils Relative  75  43 - 77 %    Neutro Abs  8.1 (*)  1.7 - 7.7 K/uL    Lymphocytes Relative  18  12 - 46 %    Lymphs Abs  2.0  0.7 - 4.0 K/uL    Monocytes Relative  6  3 - 12 %    Monocytes Absolute  0.7  0.1 - 1.0 K/uL    Eosinophils Relative  1  0 - 5 %    Eosinophils Absolute  0.1  0.0 - 0.7 K/uL    Basophils Relative  0  0 - 1 %    Basophils Absolute  0.0  0.0 - 0.1 K/uL   HCG, QUANTITATIVE, PREGNANCY Status: Abnormal    Collection Time    02/17/12 12:16 AM   Component  Value  Range    hCG, Beta Chain, Quant, S  249 (*)  <5 mIU/mL   URINALYSIS, ROUTINE W REFLEX MICROSCOPIC Status: Abnormal    Collection Time    02/17/12 12:18 AM   Component  Value  Range    Color, Urine  YELLOW  YELLOW    APPearance  CLEAR  CLEAR    Specific Gravity, Urine  1.012  1.005 - 1.030    pH  7.0  5.0 - 8.0    Glucose, UA  NEGATIVE  NEGATIVE mg/dL    Hgb urine dipstick  NEGATIVE  NEGATIVE    Bilirubin Urine  NEGATIVE  NEGATIVE    Ketones, ur  15 (*)  NEGATIVE mg/dL    Protein, ur  NEGATIVE  NEGATIVE mg/dL    Urobilinogen, UA  0.2  0.0 - 1.0 mg/dL    Nitrite  NEGATIVE  NEGATIVE    Leukocytes, UA  NEGATIVE  NEGATIVE   PREGNANCY, URINE Status: Abnormal    Collection Time    02/17/12 12:18 AM   Component  Value  Range  Preg Test, Ur  POSITIVE (*)  NEGATIVE   WET PREP, GENITAL Status: Normal    Collection Time    02/17/12 12:44 AM   Component  Value  Range    Yeast Wet Prep HPF POC  NONE SEEN  NONE SEEN    Trich, Wet Prep  NONE SEEN  NONE SEEN    Clue Cells Wet Prep HPF POC  NONE SEEN  NONE SEEN    WBC, Wet Prep HPF POC  NONE SEEN  NONE SEEN   Procedures  US Ob Comp Less 14 Wks  02/17/2012 *RADIOLOGY REPORT* Clinical Data: Abdominal pain, early pregnancy. OBSTETRIC <14 WK [place] AND TRANSVAGINAL OB US Technique: Both transabdominal and transvaginal ultrasound examinations were performed for complete evaluation of  the gestation as well as the maternal uterus, adnexal regions, and pelvic cul-de-sac. Transvaginal technique was performed to assess early pregnancy. Comparison: None. Intrauterine gestational sac: Not identified Maternal uterus/adnexae: Normal sonographic appearance to the right ovary. 2.4 x 2.1 x 2.1 cm left ovarian cyst is noted. There is a moderate amount of complex fluid/blood clot within the pelvis. IMPRESSION: No intrauterine pregnancy identified at this time. There is a moderate amount of complex fluid/blood within the cul-de-sac. Favored differential includes early pregnancy (below the threshold for ultrasound) with cyst rupture versus ectopic pregnancy. Recommend serial beta HCG and close patient observation/short-term ultrasound follow-up as warranted. Original Report Authenticated By: Jearld Lesch, M.D.  US Ob Transvaginal  02/17/2012 *RADIOLOGY REPORT* Clinical Data: Abdominal pain, early pregnancy. OBSTETRIC <14 WK [place] AND TRANSVAGINAL OB US Technique: Both transabdominal and transvaginal ultrasound examinations were performed for complete evaluation of the gestation as well as the maternal uterus, adnexal regions, and pelvic cul-de-sac. Transvaginal technique was performed to assess early pregnancy. Comparison: None. Intrauterine gestational sac: Not identified Maternal uterus/adnexae: Normal sonographic appearance to the right ovary. 2.4 x 2.1 x 2.1 cm left ovarian cyst is noted. There is a moderate amount of complex fluid/blood clot within the pelvis. IMPRESSION: No intrauterine pregnancy identified at this time. There is a moderate amount of complex fluid/blood within the cul-de-sac. Favored differential includes early pregnancy (below the threshold for ultrasound) with cyst rupture versus ectopic pregnancy. Recommend serial beta HCG and close patient observation/short-term ultrasound follow-up as warranted. Original Report Authenticated By: Jearld Lesch, M.D.  Assessment: 27 y.o. female at  approximately [redacted] weeks gestation with abdominal pain  Complex fluid within the pelvis  No IUP  Plan: Discussed with Dr. Penne Lash and she will evaluate the patient in MAU  NEESE,HOPE, RN, FNP, [facility]  02/17/2012, 3:20 AM  Pt seen and examined. Discussed case with radiologist. Pt is extremely tender with rebound and guarding. Radiologist concludes there is complex fluid in the pelvis. No ectopic pregnancy is seen. Pt could have a ruptured ovarian cyst. Given the peritoneal signs, it would be safest to proceed with diagnostic laparoscopy. Pt consented for Laparoscopy, possible salpingectomy, and removal of ectopic pregnancy. Risks include but not limited to bleeding, infection, damage to intrabdominal organs, complications from anesthesia.  OR notified and ready for OR from physician standpoint  Saphyra Hutt H. 1:27 PM

## 2012-02-23 ENCOUNTER — Inpatient Hospital Stay (HOSPITAL_COMMUNITY)
Admission: AD | Admit: 2012-02-23 | Discharge: 2012-02-23 | Disposition: A | Discharge status: Home / Self Care | Payer: BC Managed Care – PPO | Source: Ambulatory Visit | Attending: Obstetrics & Gynecology | Admitting: Obstetrics & Gynecology

## 2012-02-23 ENCOUNTER — Encounter (HOSPITAL_COMMUNITY): Payer: Self-pay | Admitting: *Deleted

## 2012-02-23 ENCOUNTER — Inpatient Hospital Stay (HOSPITAL_COMMUNITY): Payer: BC Managed Care – PPO

## 2012-02-23 ENCOUNTER — Other Ambulatory Visit (INDEPENDENT_AMBULATORY_CARE_PROVIDER_SITE_OTHER): Payer: BC Managed Care – PPO | Admitting: *Deleted

## 2012-02-23 DIAGNOSIS — O039 Complete or unspecified spontaneous abortion without complication: Secondary | ICD-10-CM

## 2012-02-23 DIAGNOSIS — O2690 Pregnancy related conditions, unspecified, unspecified trimester: Secondary | ICD-10-CM

## 2012-02-23 LAB — CBC
Hemoglobin: 14.4 g/dL (ref 12.0–15.0)
MCHC: 34.4 g/dL (ref 30.0–36.0)
Platelets: 212 10*3/uL (ref 150–400)
RBC: 4.54 MIL/uL (ref 3.87–5.11)

## 2012-02-23 LAB — HCG, QUANTITATIVE, PREGNANCY: hCG, Beta Chain, Quant, S: 2166 m[IU]/mL — ABNORMAL HIGH (ref <5)

## 2012-02-23 NOTE — MAU Provider Note (Signed)
History     CSN: [id number]  Arrival date and time: 02/23/12 1947   None     Chief Complaint  Patient presents with  . Vaginal Bleeding   HPI 27 y.o. G1P0 at approx [redacted] weeks EGA with sudden onset of bright red vaginal bleeding tonight, menstrual like cramping. Seen in MAU on 1/27 with abd pain - taken to OR for laparoscopy for ruptured right ovarian cyst vs. Right ectopic pregnancy. Quant and progesterone followed in [place], on 1/31 quant was 703, progesterone was 10. Using progesterone vaginal gel. Had repeat quant drawn to day in [place], no result yet.    Past Medical History  Diagnosis Date  . Ovarian cyst   . Ovarian cyst rupture     Past Surgical History  Procedure Date  . Knee surgery   . Tibia fracture surgery   . Fibula fracture surgery   . Thumb surgery   . Laparoscopy 02/17/2012    Procedure: LAPAROSCOPY OPERATIVE;  Surgeon: Lesly Dukes, MD;  Location: [facility];  Service: Gynecology;  Laterality: N/A;  Operative Laparoscopy, Left Salpingectomy, Cystectomy    Family History  Problem Relation Age of Onset  . Cancer Mother   . Heart disease Mother   . Hypertension Mother   . Hyperlipidemia Father     History  Substance Use Topics  . Smoking status: Never Smoker   . Smokeless tobacco: Not on file  . Alcohol Use: Yes    Allergies:  Allergies  Allergen Reactions  . Ciprofloxacin Anaphylaxis  . Clindamycin/Lincomycin Anaphylaxis  . Neomycin Anaphylaxis  . Penicillins Anaphylaxis  . Polysporin (Bacitracin-Polymyxin B) Anaphylaxis  . Vancomycin Anaphylaxis    Prescriptions prior to admission  Medication Sig Dispense Refill  . oxyCODONE-acetaminophen (ROXICET) 5-325 MG per tablet Take 1 tablet by mouth every 4 (four) hours as needed for pain.  30 tablet  0  . PROGESTERONE, VAGINAL, (CRINONE) 8 % GEL Place 1 applicator vaginally 3 (three) times daily.  1.45 g  3    Review of Systems  Constitutional: Negative.   Respiratory: Negative.    Cardiovascular: Negative.   Gastrointestinal: Positive for abdominal pain. Negative for nausea, vomiting, diarrhea and constipation.  Genitourinary: Negative for dysuria, urgency, frequency, hematuria and flank pain.       + vaginal bleeding   Musculoskeletal: Negative.   Neurological: Negative.   Psychiatric/Behavioral: Negative.    Physical Exam   Blood pressure 121/86, pulse 85, temperature 98.3 F (36.8 C), temperature source Oral, resp. rate 18, height 5\' 10"  (1.778 m), weight 181 lb 12.8 oz (82.464 kg), last menstrual period 01/17/2012.  Physical Exam  Nursing note and vitals reviewed. Constitutional: She is oriented to person, place, and time.  Cardiovascular: Normal rate.   Respiratory: Effort normal.  GI: Soft. There is no tenderness.  Genitourinary: There is bleeding (moderate, passed small approx 1 cm translucent fluid filled sac attached to larger clot) around the vagina.       Cervix slightly open   Musculoskeletal: Normal range of motion.  Neurological: She is alert and oriented to person, place, and time.  Skin: Skin is warm and dry.  Psychiatric: She has a normal mood and affect.    MAU Course  Procedures  Results for orders placed during the hospital encounter of 02/23/12 (from the past 24 hour(s))  HCG, QUANTITATIVE, PREGNANCY     Status: Abnormal   Collection Time   02/23/12  8:42 PM      Component Value Range   hCG, Beta  Nyra Jabs, Kathie Rhodes 2166 (*) <5 mIU/mL  CBC     Status: Normal   Collection Time   02/23/12  8:42 PM      Component Value Range   WBC 9.8  4.0 - 10.5 K/uL   RBC 4.54  3.87 - 5.11 MIL/uL   Hemoglobin 14.4  12.0 - 15.0 g/dL   HCT 16.1  09.6 - 04.5 %   MCV 92.3  78.0 - 100.0 fL   MCH 31.7  26.0 - 34.0 pg   MCHC 34.4  30.0 - 36.0 g/dL   RDW 40.9  81.1 - 91.4 %   Platelets 212  150 - 400 K/uL  ABO/RH     Status: Normal (Preliminary result)   Collection Time   02/23/12  8:42 PM      Component Value Range   ABO/RH(D) O POS     US Ob Comp  Less 14 Wks  02/17/2012  **ADDENDUM** CREATED: 02/17/2012 03:28:37  Discussed via telephone with Kerrie Buffalo at 03:30 a.m. on 02/17/2012.  **END ADDENDUM** SIGNED BY: Waneta Martins, M.D.   02/17/2012  *RADIOLOGY REPORT*  Clinical Data: Abdominal pain, early pregnancy.  OBSTETRIC <14 WK [place] AND TRANSVAGINAL OB US  Technique:  Both transabdominal and transvaginal ultrasound examinations were performed for complete evaluation of the gestation as well as the maternal uterus, adnexal regions, and pelvic cul-de-sac.  Transvaginal technique was performed to assess early pregnancy.  Comparison:  None.  Intrauterine gestational sac:  Not identified  Maternal uterus/adnexae: Normal sonographic appearance to the right ovary.  2.4 x 2.1 x 2.1 cm left ovarian cyst is noted.  There is a moderate amount of complex fluid/blood clot within the pelvis.  IMPRESSION: No intrauterine pregnancy identified at this time.  There is a moderate amount of complex fluid/blood within the cul-de-sac.  Favored differential includes early pregnancy (below the threshold for ultrasound) with cyst rupture versus ectopic pregnancy. Recommend serial beta HCG and close patient observation/short-term ultrasound follow-up as warranted.   Original Report Authenticated By: Jearld Lesch, M.D.    US Ob Transvaginal  02/23/2012  *RADIOLOGY REPORT*  Clinical Data: Bleeding.  Question has products of conception? 02/17/2012 exploratory surgery with removal of right tube.  TRANSVAGINAL OB ULTRASOUND  Technique:  Transvaginal ultrasound was performed for evaluation of the gestation as well as the maternal uterus and adnexal regions.  Comparison: 02/17/2012.  Findings: Intrauterine gestational sac is not visualized. Endometrial thickness of 9.9 mm.  Moderate amount of complex fluid greater to the right.  Right ovary 3.9 x 2.4 x 2.3 cm.  Left ovary 4.3 x 2.9 x 2.8 cm.  IMPRESSION: In the setting of a rising Beta HCG and lack of visualization of an  intrauterine gestation with complex free fluid, possibility of ectopic pregnancy needs be considered and excluded.  Critical Value/emergent results were called by telephone at the time of interpretation on 02/23/2012 at 10:42 p.m. to [facility] the patient's nurse, who verbally acknowledged these results.  It may be that the patient has products of conception.  Close correlation with Beta HCG levels recommended.   Original Report Authenticated By: Lacy Duverney, M.D.    US Ob Transvaginal  02/17/2012  **ADDENDUM** CREATED: 02/17/2012 03:28:37  Discussed via telephone with Kerrie Buffalo at 03:30 a.m. on 02/17/2012.  **END ADDENDUM** SIGNED BY: Waneta Martins, M.D.   02/17/2012  *RADIOLOGY REPORT*  Clinical Data: Abdominal pain, early pregnancy.  OBSTETRIC <14 WK [place] AND TRANSVAGINAL OB US  Technique:  Both transabdominal  and transvaginal ultrasound examinations were performed for complete evaluation of the gestation as well as the maternal uterus, adnexal regions, and pelvic cul-de-sac.  Transvaginal technique was performed to assess early pregnancy.  Comparison:  None.  Intrauterine gestational sac:  Not identified  Maternal uterus/adnexae: Normal sonographic appearance to the right ovary.  2.4 x 2.1 x 2.1 cm left ovarian cyst is noted.  There is a moderate amount of complex fluid/blood clot within the pelvis.  IMPRESSION: No intrauterine pregnancy identified at this time.  There is a moderate amount of complex fluid/blood within the cul-de-sac.  Favored differential includes early pregnancy (below the threshold for ultrasound) with cyst rupture versus ectopic pregnancy. Recommend serial beta HCG and close patient observation/short-term ultrasound follow-up as warranted.   Original Report Authenticated By: Jearld Lesch, M.D.     Assessment and Plan   1. Miscarriage   Pt most likely passed POC during exam - specimen sent to pathology. However, unable to completely r/o ectopic considering quant of 2100 and no  IUP ever visualized on u/s. Pt will return in 2 days for repeat quant. Rev'd precautions.     Medication List     As of 02/23/2012 11:03 PM    CONTINUE taking these medications         oxyCODONE-acetaminophen 5-325 MG per tablet   Commonly known as: PERCOCET/ROXICET   Take 1 tablet by mouth every 4 (four) hours as needed for pain.      STOP taking these medications         PROGESTERONE (VAGINAL) 8 % Gel            Follow-up Information    Follow up with THE [facility] [place] MATERNITY ADMISS[facility]. On 02/25/2012. (for repeat labs)    Contact information:   [address] [id number] mc [place] [address] [phone]           Annmarie Plemmons 02/23/2012, 9:00 PM

## 2012-02-23 NOTE — MAU Note (Signed)
Pt post emergency surgery for ruptured ovarian cyst on 1/28.  LMP 01/16/2012, +UPT 1/27.  Tonight started dark red bleeding at 1900 and cramping.  Bleeding is now bright red and passing clots.

## 2012-02-23 NOTE — Progress Notes (Unsigned)
Pt here for BHCG and P4 only.  Prior authorization pending for her Crinone gel.  Pt has paid out of pocket for the med and I initiated prior authorization with [employer].

## 2012-02-23 NOTE — MAU Provider Note (Signed)
Attestation of Attending Supervision of Advanced Practitioner (PA/CNM/NP): Evaluation and management procedures were performed by the Advanced Practitioner under my supervision and collaboration.  I have reviewed the Advanced Practitioner's note and chart, and I agree with the management and plan.  Carliss Porcaro, MD, FACOG Attending Obstetrician & Gynecologist Faculty Practice, [facility]  

## 2012-02-24 LAB — PROGESTERONE: Progesterone: 7.9 ng/mL

## 2012-02-25 ENCOUNTER — Inpatient Hospital Stay (HOSPITAL_COMMUNITY)
Admission: AD | Admit: 2012-02-25 | Discharge: 2012-02-25 | Disposition: A | Discharge status: Home / Self Care | Payer: BC Managed Care – PPO | Source: Ambulatory Visit | Attending: Obstetrics and Gynecology | Admitting: Obstetrics and Gynecology

## 2012-02-25 DIAGNOSIS — Z09 Encounter for follow-up examination after completed treatment for conditions other than malignant neoplasm: Secondary | ICD-10-CM

## 2012-02-25 DIAGNOSIS — O039 Complete or unspecified spontaneous abortion without complication: Secondary | ICD-10-CM | POA: Insufficient documentation

## 2012-02-25 LAB — HCG, QUANTITATIVE, PREGNANCY: hCG, Beta Chain, Quant, S: 256 m[IU]/mL — ABNORMAL HIGH (ref <5)

## 2012-02-25 NOTE — MAU Provider Note (Signed)
Janice Cooper is a 27 y.o. female who presents to MAU for follow up Bchg. She was evaluated 2 days ago for SAB and her Bhcg at that time was 2166. Tonight it has dropped to 256.  Patient has minimal cramping that she rates a 3/10 and a small amount of bleeding.    Results for orders placed during the hospital encounter of 02/25/12 (from the past 24 hour(s))  HCG, QUANTITATIVE, PREGNANCY     Status: Abnormal   Collection Time   02/25/12  8:15 PM      Component Value Range   hCG, Beta Chain, Quant, S 256 (*) <5 mIU/mL    Assessment: SAB  Plan:  Follow up in GYN Clinic in 2 weeks   Message sent to GYN Clinic   Discussed results with the patient and plan of care

## 2012-02-25 NOTE — MAU Note (Signed)
Pt states she is here for follow up labs and states she does not feel like she needs to be seen-states she was told we will call the lab results to her

## 2012-02-25 NOTE — MAU Note (Signed)
H.Neese,NP, called the pt and discussed the lab results and the plan of care

## 2012-02-26 ENCOUNTER — Encounter: Payer: Self-pay | Admitting: Advanced Practice Midwife

## 2012-03-05 NOTE — MAU Provider Note (Signed)
Attestation of Attending Supervision of Advanced Practitioner: Evaluation and management procedures were performed by the PA/NP/CNM/OB Fellow under my supervision/collaboration. Chart reviewed and agree with management and plan.  Carmine Youngberg V 03/05/2012 8:38 AM

## 2012-03-06 ENCOUNTER — Other Ambulatory Visit: Payer: Self-pay

## 2012-03-15 ENCOUNTER — Encounter: Payer: BC Managed Care – PPO | Admitting: Advanced Practice Midwife

## 2012-03-18 ENCOUNTER — Ambulatory Visit (INDEPENDENT_AMBULATORY_CARE_PROVIDER_SITE_OTHER): Payer: BC Managed Care – PPO | Admitting: Obstetrics & Gynecology

## 2012-03-18 ENCOUNTER — Encounter: Payer: Self-pay | Admitting: Obstetrics & Gynecology

## 2012-03-18 VITALS — BP 122/81 | HR 71 | Temp 97.9°F | Ht 70.0 in | Wt 179.6 lb

## 2012-03-18 DIAGNOSIS — O039 Complete or unspecified spontaneous abortion without complication: Secondary | ICD-10-CM

## 2012-03-18 DIAGNOSIS — Z09 Encounter for follow-up examination after completed treatment for conditions other than malignant neoplasm: Secondary | ICD-10-CM

## 2012-03-18 DIAGNOSIS — N83209 Unspecified ovarian cyst, unspecified side: Secondary | ICD-10-CM

## 2012-03-18 LAB — HCG, QUANTITATIVE, PREGNANCY: hCG, Beta Chain, Quant, S: <2 m[IU]/mL

## 2012-03-18 NOTE — Progress Notes (Signed)
Patient ID: Janice Cooper, female   DOB: [birth year], 27 y.o.   MRN: [id number]  Chief Complaint  Patient presents with  . Follow-up    HPI Janice Cooper is a 27 y.o. female.  G1P0 She had a laparoscopy 02/16/12 for ruptured ovarian cyst in early pregnancy. HCG has dropped but last value was 3 weeks ago.  HPI  Past Medical History  Diagnosis Date  . Ovarian cyst   . Ovarian cyst rupture   . Asthma     Past Surgical History  Procedure Laterality Date  . Knee surgery    . Tibia fracture surgery    . Fibula fracture surgery    . Thumb surgery    . Laparoscopy  02/17/2012    Procedure: LAPAROSCOPY OPERATIVE;  Surgeon: Lesly Dukes, MD;  Location: [facility];  Service: Gynecology;  Laterality: N/A;  Operative Laparoscopy, Left Salpingectomy, Cystectomy  . Skin grafts Left     Left lower extremity    Family History  Problem Relation Age of Onset  . Cancer Mother   . Heart disease Mother   . Hypertension Mother   . Hyperlipidemia Father     Social History History  Substance Use Topics  . Smoking status: Never Smoker   . Smokeless tobacco: Not on file  . Alcohol Use: 0.6 oz/week    1 Glasses of wine per week    Allergies  Allergen Reactions  . Ciprofloxacin Anaphylaxis  . Clindamycin/Lincomycin Anaphylaxis  . Neomycin Anaphylaxis  . Penicillins Anaphylaxis  . Polysporin (Bacitracin-Polymyxin B) Anaphylaxis  . Vancomycin Anaphylaxis    Current Outpatient Prescriptions  Medication Sig Dispense Refill  . oxyCODONE-acetaminophen (ROXICET) 5-325 MG per tablet Take 1 tablet by mouth every 4 (four) hours as needed for pain.  30 tablet  0   No current facility-administered medications for this visit.    Review of Systems Review of Systems  Gastrointestinal: Positive for abdominal pain (improving but still sore). Negative for abdominal distention.  Genitourinary: Negative for vaginal bleeding and vaginal discharge.    Blood pressure 122/81, pulse 71, temperature 97.9  F (36.6 C), temperature source Oral, height 5\' 10"  (1.778 m), weight 179 lb 9.6 oz (81.466 kg), last menstrual period 01/17/2012.  Physical Exam Physical Exam  Nursing note and vitals reviewed. Constitutional: No distress.  Abdominal: Soft. She exhibits no distension. There is no tenderness.  Incisions well-healing  Skin: Skin is warm and dry.  Psychiatric: She has a normal mood and affect. Her behavior is normal.    Data Reviewed Op note, labs  Assessment    Likely complete Ab, s/p L/S     Plan    Report if sx not improving. Repeat HCG        ARNOLD,JAMES 03/18/2012, 4:32 PM

## 2012-03-18 NOTE — Patient Instructions (Signed)
Diagnostic Laparoscopy  Laparoscopy is a surgical procedure. It is used to diagnose and treat diseases inside the belly(abdomen). It is usually a brief, common, and relatively simple procedure. The laparoscopeis a thin, lighted, pencil-sized instrument. It is like a telescope. It is inserted into your abdomen through a small cut (incision). Your caregiver can look at the organs inside your body through this instrument. He or she can see if there is anything abnormal.  Laparoscopy can be done either in a hospital or outpatient clinic. You may be given a mild sedative to help you relax before the procedure. Once in the operating room, you will be given a drug to make you sleep (general anesthesia). Laparoscopy usually lasts less than 1 hour. After the procedure, you will be monitored in a recovery area until you are stable and doing well. Once you are home, it will take 2 to 3 days to fully recover.  RISKS AND COMPLICATIONS   Laparoscopy has relatively few risks. Your caregiver will discuss the risks with you before the procedure.  Some problems that can occur include:  · Infection.  · Bleeding.  · Damage to other organs.  · Anesthetic side effects.  PROCEDURE  Once you receive anesthesia, your surgeon inflates the abdomen with a harmless gas (carbon dioxide). This makes the organs easier to see. The laparoscope is inserted into the abdomen through a small incision. This allows your surgeon to see into the abdomen. Other small instruments are also inserted into the abdomen through other small openings. Many surgeons attach a video camera to the laparoscope to enlarge the view.  During a diagnostic laparoscopy, the surgeon may be looking for inflammation, infection, or cancer. Your surgeon may take tissue samples(biopsies). The samples are sent to a specialist in looking at cells and tissue samples (pathologist). The pathologist examines them under a microscope. Biopsies can help to diagnose or confirm a  disease.  AFTER THE PROCEDURE   · The gas is released from inside the abdomen.  · The incisions are closed with stitches (sutures). Because these incisions are small (usually less than 1/2 inch), there is usually minimal discomfort after the procedure. There may be some mild discomfort in the throat. This is from the tube placed in the throat while you were sleeping. You may have some mild abdominal discomfort. There may also be discomfort from the instrument placement incisions in the abdomen.  · The recovery time is shortened as long as there are no complications.  · You will rest in a recovery room until stable and doing well. As long as there are no complications, you may be allowed to go home.  FINDING OUT THE RESULTS OF YOUR TEST  Not all test results are available during your visit. If your test results are not back during the visit, make an appointment with your caregiver to find out the results. Do not assume everything is normal if you have not heard from your caregiver or the medical facility. It is important for you to follow up on all of your test results.  HOME CARE INSTRUCTIONS   · Take all medicines as directed.  · Only take over-the-counter or prescription medicines for pain, discomfort, or fever as directed by your caregiver.  · Resume daily activities as directed.  · Showers are preferred over baths.  · You may resume sexual activities in 1 week or as directed.  · Do not drive while taking narcotics.  SEEK MEDICAL CARE IF:   · There is increasing   abdominal pain.  · There is new pain in the shoulders (shoulder strap areas).  · You feel lightheaded or faint.  · You have the chills.  · You or your child has an oral temperature above 102° F (38.9° C).  · There is pus-like (purulent) drainage from any of the wounds.  · You are unable to pass gas or have a bowel movement.  · You feel sick to your stomach (nauseous) or throw up (vomit).  MAKE SURE YOU:   · Understand these instructions.  · Will watch  your condition.  · Will get help right away if you are not doing well or get worse.  Document Released: 04/14/2000 Document Revised: 03/31/2011 Document Reviewed: 01/06/2007  [employer]® Patient Information ©2013 [employer].

## 2012-07-30 ENCOUNTER — Emergency Department (HOSPITAL_BASED_OUTPATIENT_CLINIC_OR_DEPARTMENT_OTHER): Payer: BC Managed Care – PPO

## 2012-07-30 ENCOUNTER — Encounter (HOSPITAL_BASED_OUTPATIENT_CLINIC_OR_DEPARTMENT_OTHER): Payer: Self-pay | Admitting: *Deleted

## 2012-07-30 ENCOUNTER — Emergency Department (HOSPITAL_BASED_OUTPATIENT_CLINIC_OR_DEPARTMENT_OTHER)
Admission: EM | Admit: 2012-07-30 | Discharge: 2012-07-30 | Disposition: A | Discharge status: Home / Self Care | Payer: BC Managed Care – PPO | Attending: Emergency Medicine | Admitting: Emergency Medicine

## 2012-07-30 DIAGNOSIS — Z8742 Personal history of other diseases of the female genital tract: Secondary | ICD-10-CM | POA: Insufficient documentation

## 2012-07-30 DIAGNOSIS — Z880 Allergy status to penicillin: Secondary | ICD-10-CM | POA: Insufficient documentation

## 2012-07-30 DIAGNOSIS — N949 Unspecified condition associated with female genital organs and menstrual cycle: Secondary | ICD-10-CM | POA: Insufficient documentation

## 2012-07-30 DIAGNOSIS — Z3202 Encounter for pregnancy test, result negative: Secondary | ICD-10-CM | POA: Insufficient documentation

## 2012-07-30 DIAGNOSIS — J45909 Unspecified asthma, uncomplicated: Secondary | ICD-10-CM | POA: Insufficient documentation

## 2012-07-30 DIAGNOSIS — R102 Pelvic and perineal pain: Secondary | ICD-10-CM

## 2012-07-30 LAB — CBC WITH DIFFERENTIAL/PLATELET
Eosinophils Absolute: 0.3 10*3/uL (ref 0.0–0.7)
Eosinophils Relative: 4 % (ref 0–5)
HCT: 41.2 % (ref 36.0–46.0)
Hemoglobin: 14.1 g/dL (ref 12.0–15.0)
Lymphs Abs: 1.8 10*3/uL (ref 0.7–4.0)
MCH: 32.8 pg (ref 26.0–34.0)
MCV: 95.8 fL (ref 78.0–100.0)
Monocytes Absolute: 0.7 10*3/uL (ref 0.1–1.0)
Monocytes Relative: 11 % (ref 3–12)
RBC: 4.3 MIL/uL (ref 3.87–5.11)

## 2012-07-30 LAB — URINALYSIS, ROUTINE W REFLEX MICROSCOPIC
Bilirubin Urine: NEGATIVE
Ketones, ur: NEGATIVE mg/dL
Leukocytes, UA: NEGATIVE
Nitrite: NEGATIVE
Urobilinogen, UA: 0.2 mg/dL (ref 0.0–1.0)

## 2012-07-30 LAB — LIPASE, BLOOD: Lipase: 31 U/L (ref 11–59)

## 2012-07-30 LAB — COMPREHENSIVE METABOLIC PANEL
Alkaline Phosphatase: 60 U/L (ref 39–117)
BUN: 9 mg/dL (ref 6–23)
Calcium: 9.6 mg/dL (ref 8.4–10.5)
Creatinine, Ser: 0.8 mg/dL (ref 0.50–1.10)
GFR calc Af Amer: >90 mL/min (ref >90)
Glucose, Bld: 96 mg/dL (ref 70–99)
Potassium: 3.5 mEq/L (ref 3.5–5.1)
Total Protein: 7.2 g/dL (ref 6.0–8.3)

## 2012-07-30 LAB — WET PREP, GENITAL: Yeast Wet Prep HPF POC: NONE SEEN

## 2012-07-30 MED ORDER — DIPHENHYDRAMINE HCL 50 MG/ML IJ SOLN: 25.0000 mg | Freq: Once | INTRAMUSCULAR | Status: AC

## 2012-07-30 MED ORDER — HYDROMORPHONE HCL PF 1 MG/ML IJ SOLN
1.0000 mg | Freq: Once | INTRAMUSCULAR | Status: AC
  Administered 2012-07-30: 1 mg via INTRAVENOUS
  Filled 2012-07-30: qty 1

## 2012-07-30 MED ORDER — DIPHENHYDRAMINE HCL 50 MG/ML IJ SOLN
INTRAMUSCULAR | Status: AC
  Administered 2012-07-30: 25 mg via INTRAVENOUS
  Filled 2012-07-30: qty 1

## 2012-07-30 MED ORDER — ONDANSETRON HCL 4 MG/2ML IJ SOLN
4.0000 mg | Freq: Once | INTRAMUSCULAR | Status: AC
Start: 2012-07-30 — End: 2012-07-30
  Administered 2012-07-30: 4 mg via INTRAVENOUS
  Filled 2012-07-30: qty 2

## 2012-07-30 MED ORDER — SODIUM CHLORIDE 0.9 % IV SOLN
1000.0000 mL | INTRAVENOUS | Status: DC
  Administered 2012-07-30: 1000 mL via INTRAVENOUS

## 2012-07-30 MED ORDER — OXYCODONE-ACETAMINOPHEN 5-325 MG PO TABS
1.0000 | ORAL_TABLET | ORAL | Status: AC | PRN
Start: 2012-07-30 — End: ?

## 2012-07-30 NOTE — ED Notes (Signed)
Patient transported to Ultrasound 

## 2012-07-30 NOTE — ED Notes (Signed)
Pt c/o right flank pain x 2 days today pain radiates to lower abd nausea only

## 2012-07-30 NOTE — ED Notes (Signed)
Patient transported to CT 

## 2012-07-30 NOTE — ED Provider Notes (Signed)
History    CSN: [id number] Arrival date & time 07/30/12  1831  First MD Initiated Contact with Patient 07/30/12 1924     Chief Complaint  Patient presents with  . Abdominal Pain   (Consider location/radiation/quality/duration/timing/severity/associated sxs/prior Treatment) HPI Comments: Patient is a 18 none with right flank pain. She says she is in mild pain intermittently for 3-5 days. The pain became suddenly worse about 3 hours ago. She feels the pain from the right flank and right lower abdomen. He has not had pain like this for however, she did have a ruptured left ovarian cyst that causes him severe pain in January of this year. She also had a spontaneous abortion.  Patient is a 27 y.o. female presenting with abdominal pain. The history is provided by the patient and medical records. No language interpreter was used.  Abdominal Pain This is a new problem. The current episode started 3 to 5 hours ago. The problem occurs constantly. The problem has not changed since onset.Associated symptoms include abdominal pain. Pertinent negatives include no chest pain and no shortness of breath. Nothing aggravates the symptoms. Nothing relieves the symptoms. She has tried nothing for the symptoms.   Past Medical History  Diagnosis Date  . Ovarian cyst   . Ovarian cyst rupture   . Asthma    Past Surgical History  Procedure Laterality Date  . Knee surgery    . Tibia fracture surgery    . Fibula fracture surgery    . Thumb surgery    . Laparoscopy  02/17/2012    Procedure: LAPAROSCOPY OPERATIVE;  Surgeon: Lesly Dukes, MD;  Location: [facility];  Service: Gynecology;  Laterality: N/A;  Operative Laparoscopy, Left Salpingectomy, Cystectomy  . Skin grafts Left     Left lower extremity   Family History  Problem Relation Age of Onset  . Cancer Mother   . Heart disease Mother   . Hypertension Mother   . Hyperlipidemia Father    History  Substance Use Topics  . Smoking status: Never Smoker    . Smokeless tobacco: Not on file  . Alcohol Use: 0.6 oz/week    1 Glasses of wine per week   OB History   Grav Para Term Preterm Abortions TAB SAB Ect Mult Living   1              Review of Systems  Constitutional: Negative for fever and chills.  HENT: Negative.   Eyes: Negative.   Respiratory: Negative.  Negative for shortness of breath.   Cardiovascular: Negative for chest pain.  Gastrointestinal: Positive for abdominal pain.  Genitourinary: Positive for flank pain.  Musculoskeletal: Negative.   Neurological: Negative.   Psychiatric/Behavioral: Negative.     Allergies  Ciprofloxacin; Clindamycin/lincomycin; Neomycin; Penicillins; Polysporin; and Vancomycin  Home Medications  No current outpatient prescriptions on file. BP 127/89  Pulse 79  Temp(Src) 98.1 F (36.7 C)  Resp 16  Ht 5\' 10"  (1.778 m)  Wt 185 lb (83.915 kg)  BMI 26.54 kg/m2  SpO2 100%  LMP 06/30/2012  Breastfeeding? No Physical Exam  Nursing note and vitals reviewed. Constitutional: She is oriented to person, place, and time. She appears well-developed and well-nourished. No distress.  HENT:  Head: Normocephalic and atraumatic.  Right Ear: External ear normal.  Left Ear: External ear normal.  Mouth/Throat: Oropharynx is clear and moist.  Eyes: Conjunctivae and EOM are normal. Pupils are equal, round, and reactive to light.  Neck: Normal range of motion. Neck supple.  Cardiovascular:  Normal rate, regular rhythm and normal heart sounds.   Pulmonary/Chest: Effort normal and breath sounds normal.  Abdominal: Soft.  His right flank and right lower quadrant localization of pain she localizes mainly to abdomen, but there is no mass or tenderness to palpation.  Genitourinary:  Normal female external genitalia.  Has dark blood in vaginal vault.  Has some tenderness over right adnexal region.  No uterine or left adnexal tenderness.  Musculoskeletal: Normal range of motion. She exhibits no edema and no  tenderness.  Neurological: She is alert and oriented to person, place, and time.  No sensory or motor deficit  Skin: Skin is warm and dry.  Psychiatric: She has a normal mood and affect. Her behavior is normal.    ED Course  Procedures (including critical care time) Labs Reviewed  URINALYSIS, ROUTINE W REFLEX MICROSCOPIC - Abnormal; Notable for the following:    Hgb urine dipstick TRACE (*)    All other components within normal limits  PREGNANCY, URINE  URINE MICROSCOPIC-ADD ON  CBC WITH DIFFERENTIAL  COMPREHENSIVE METABOLIC PANEL  LIPASE, BLOOD   8:04 PM Pt was seen and had physical examination.  IV pain medicine ordered.  Labs and CT abdomen/pelvis without contrast was ordered.  Old charts were reviewed.  10:13 PM Lab tests and CT abdomen/pelvis were negative.  Pelvic exam performed and pelvic ultrasound ordered.   Has itching, probably histamine-like reaction to Dilaudid.  Rx Benadryl 25 mg IV.   10:46 PM Pelvic ultrasound showed normal blood flow in both ovaries.  Wet prep was negative.  No specific finding to explain her pain.  Will give Rx for Percocet q4h prn pain.  She has an appointment with Dr. Loreta Ave at [facility] on next Wednesday, 5 days from now.  She would need recheck if she had worsening pain, severe vomiting, or high fever.     1. Pelvic pain in female          Carleene Cooper III, MD 07/31/12 1243

## 2012-11-25 ENCOUNTER — Other Ambulatory Visit: Payer: Self-pay

## 2013-11-21 ENCOUNTER — Encounter (HOSPITAL_BASED_OUTPATIENT_CLINIC_OR_DEPARTMENT_OTHER): Payer: Self-pay | Admitting: *Deleted

## 2014-02-17 IMAGING — CT CT ABD-PELV W/O CM
2 of 4 series · 16 of 46 positions shown, 18 images · non-contrast
Comparison: None.

CLINICAL DATA: Right flank pain radiating to groin.

CT ABDOMEN AND PELVIS WITHOUT CONTRAST
TECHNIQUE: Multidetector CT imaging of the abdomen and pelvis was
performed following the standard protocol without intravenous
contrast.

[Series 2: renal stone < 200 lbs 5.0 b31f · axial · 0.69mm/px · z∈[+768,+1208]mm · 13 of 96 slices shown, 15 images]
[im 4/96  soft-tissue]
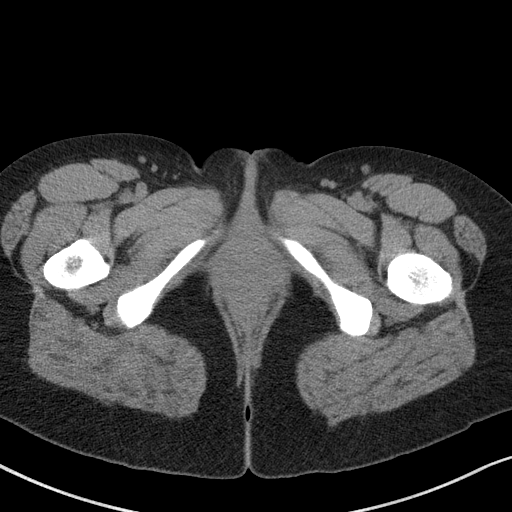
[im 4/96  bone]
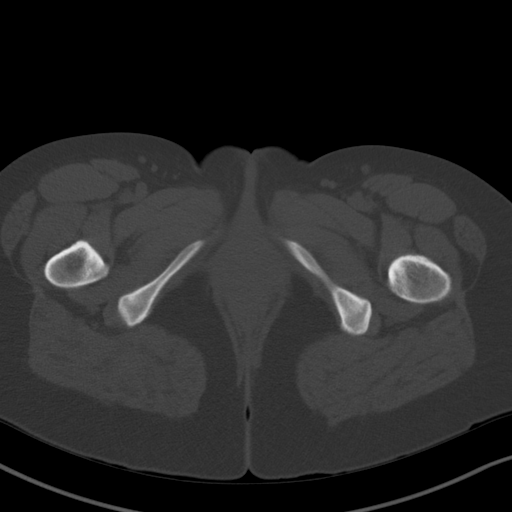
[im 11/96  soft-tissue]
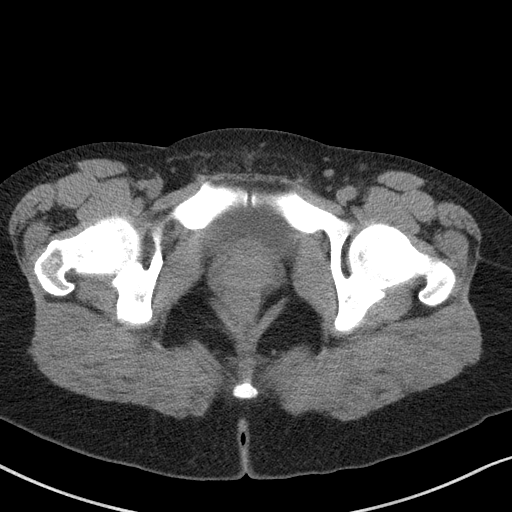
[im 19/96  soft-tissue]
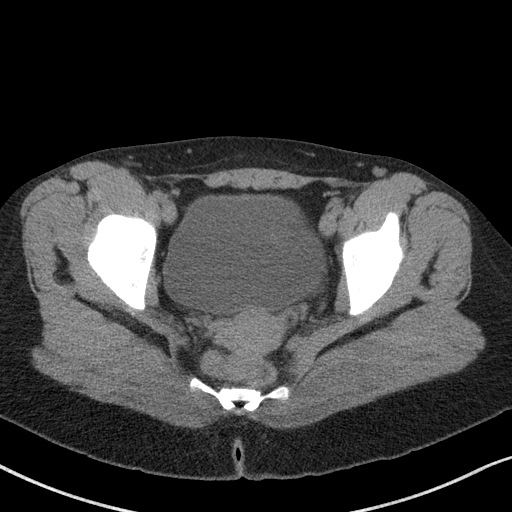
[im 26/96  soft-tissue]
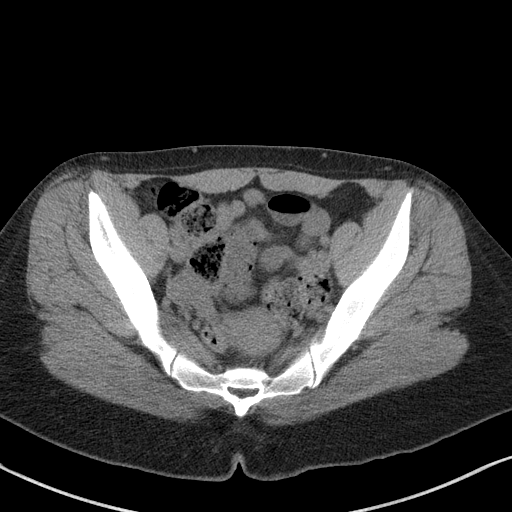
[im 33/96  soft-tissue]
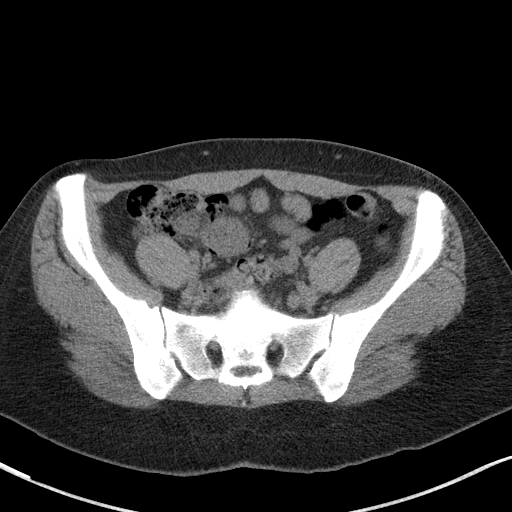
[im 41/96  soft-tissue]
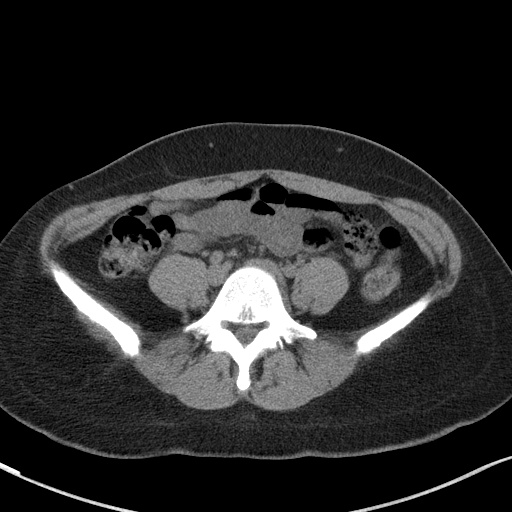
[im 48/96  soft-tissue]
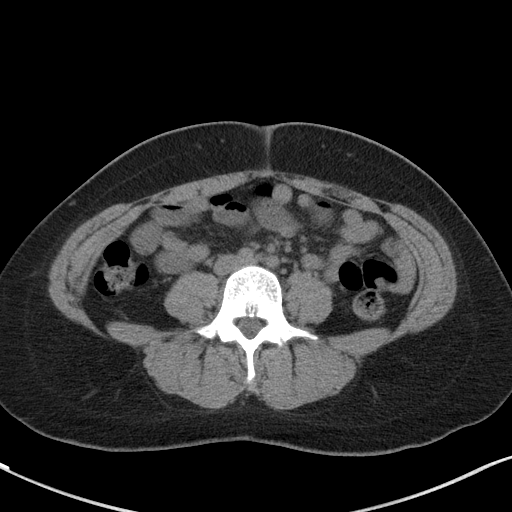
[im 55/96  soft-tissue]
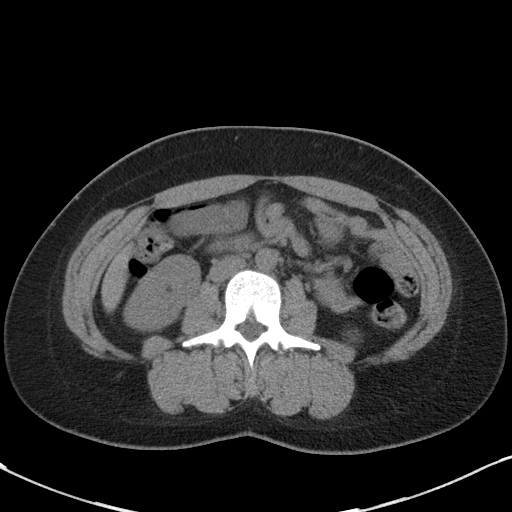
[im 63/96  soft-tissue]
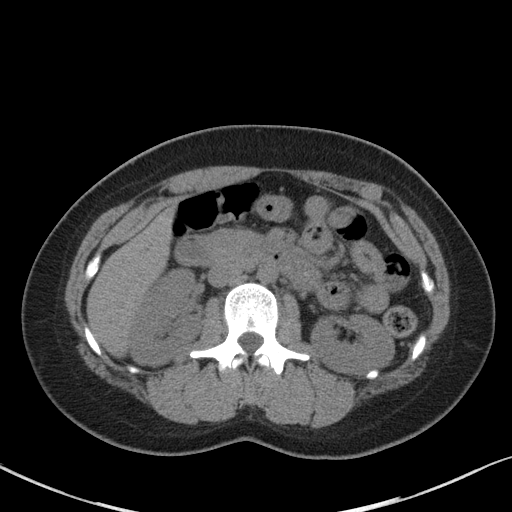
[im 63/96  bone]
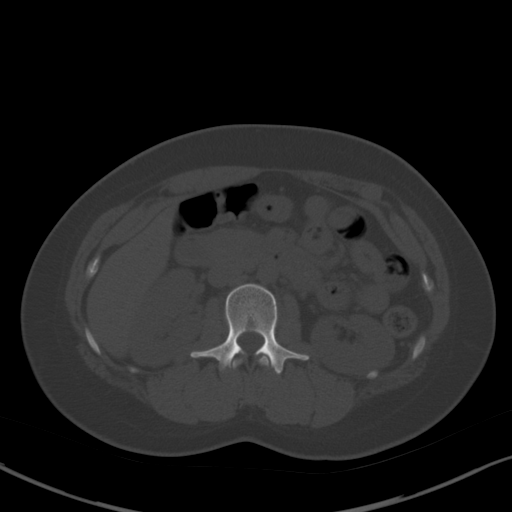
[im 70/96  soft-tissue]
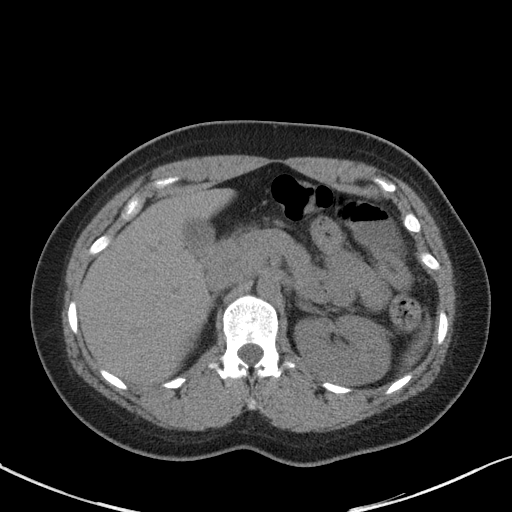
[im 77/96  soft-tissue]
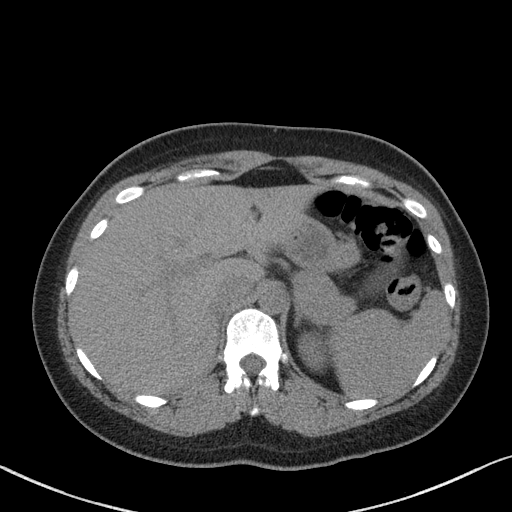
[im 85/96  soft-tissue]
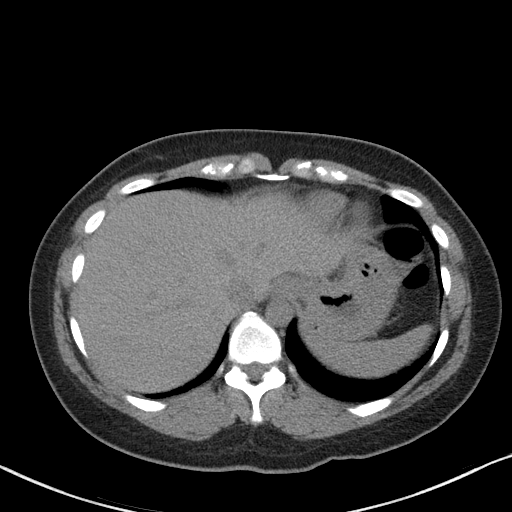
[im 92/96  soft-tissue]
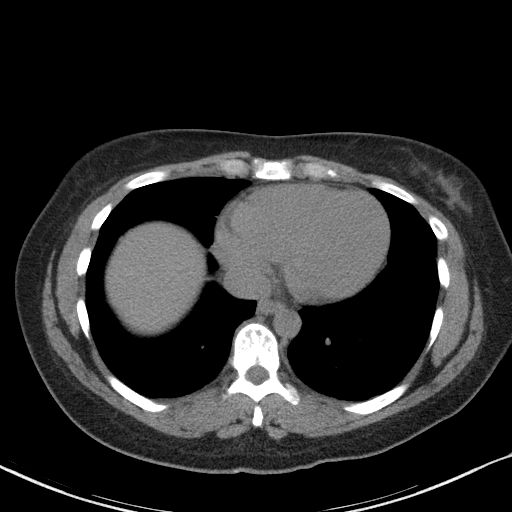

[Series 5: renal stone 3.0 coronal · coronal · 0.81mm/px · 3 of 64 slices shown]
[im 22/64  soft-tissue]
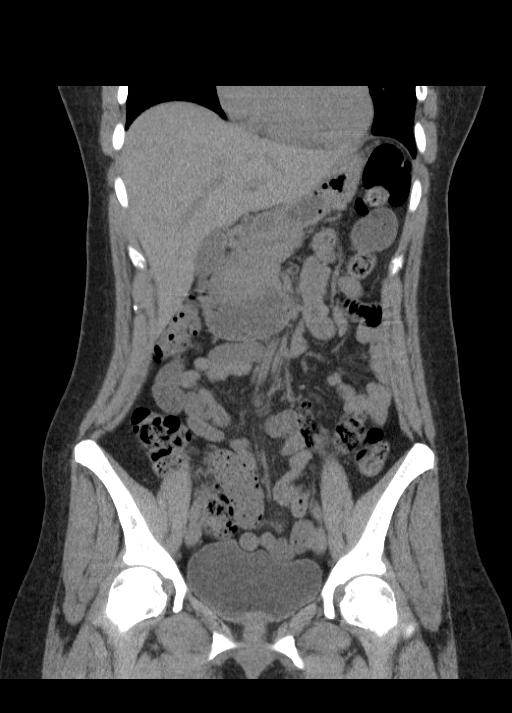
[im 29/64  soft-tissue]
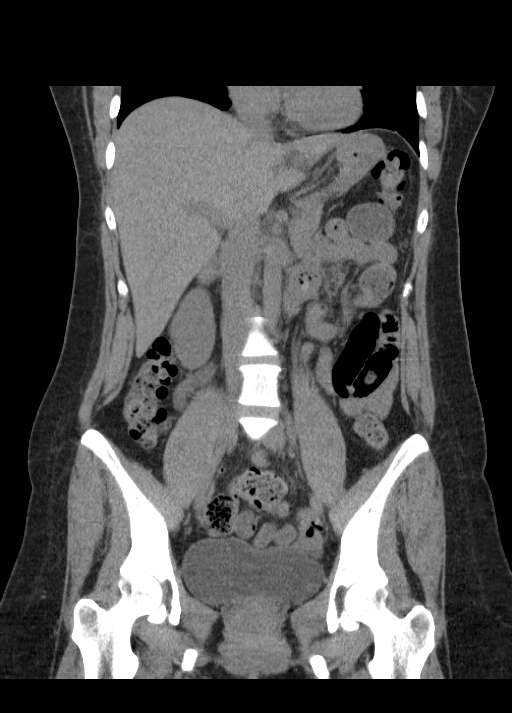
[im 36/64  soft-tissue]
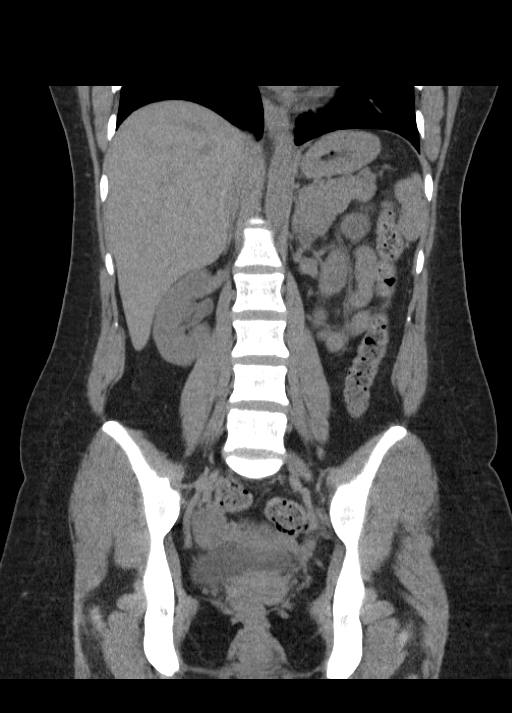

[16 of 46 positions shown; findings below may reference images not displayed]

FINDINGS: No evidence of renal calculi or hydronephrosis.  No
evidence of ureteral calculi or dilatation.  No bladder calculi
identified.

Noncontrast images of the liver, gallbladder, spleen, pancreas, and
adrenal glands are unremarkable in appearance.  No soft tissue
masses or lymphadenopathy identified.  Uterus and adnexae are
unremarkable.  No evidence of inflammatory process or abnormal
fluid collections.  No evidence of bowel wall thickening or
dilatation.
IMPRESSION: No evidence of urolithiasis, hydronephrosis, or other acute
findings.

## 2014-06-23 LAB — US OB FOLLOW UP

## 2014-07-06 ENCOUNTER — Other Ambulatory Visit (HOSPITAL_COMMUNITY): Payer: Self-pay | Admitting: Obstetrics & Gynecology

## 2014-07-06 DIAGNOSIS — O35EXX0 Maternal care for other (suspected) fetal abnormality and damage, fetal genitourinary anomalies, not applicable or unspecified: Secondary | ICD-10-CM

## 2014-07-06 DIAGNOSIS — O358XX0 Maternal care for other (suspected) fetal abnormality and damage, not applicable or unspecified: Secondary | ICD-10-CM

## 2014-07-07 ENCOUNTER — Encounter (HOSPITAL_COMMUNITY): Payer: Self-pay

## 2014-07-07 ENCOUNTER — Ambulatory Visit (HOSPITAL_COMMUNITY)
Admission: RE | Admit: 2014-07-07 | Discharge: 2014-07-07 | Disposition: A | Discharge status: Home / Self Care | Payer: BC Managed Care – PPO | Source: Ambulatory Visit | Attending: Obstetrics & Gynecology | Admitting: Obstetrics & Gynecology

## 2014-07-07 ENCOUNTER — Other Ambulatory Visit (HOSPITAL_COMMUNITY): Payer: Self-pay | Admitting: Obstetrics & Gynecology

## 2014-07-07 DIAGNOSIS — O358XX0 Maternal care for other (suspected) fetal abnormality and damage, not applicable or unspecified: Secondary | ICD-10-CM | POA: Diagnosis not present

## 2014-07-07 DIAGNOSIS — Z3A30 30 weeks gestation of pregnancy: Secondary | ICD-10-CM | POA: Insufficient documentation

## 2014-07-07 DIAGNOSIS — Z36 Encounter for antenatal screening of mother: Secondary | ICD-10-CM | POA: Insufficient documentation

## 2014-07-07 DIAGNOSIS — O35EXX0 Maternal care for other (suspected) fetal abnormality and damage, fetal genitourinary anomalies, not applicable or unspecified: Secondary | ICD-10-CM

## 2014-07-07 DIAGNOSIS — Z3689 Encounter for other specified antenatal screening: Secondary | ICD-10-CM | POA: Insufficient documentation

## 2014-07-07 NOTE — Consult Note (Signed)
MFM consultation, staff note:  Impression:  Urinary Tract Dilation (formerly known as pyelectasis)  Mild urinary tract (UT) dilation, formerly known as pyelectasis, was identified. Isolated UT dilation is observed in about 1 - 2 % of pregnancies at around 18-20 weeks of gestation. Most instances are of no pathological significance. Classification is based upon AP renal pelvic diameter, calyceal dilation, renal parenchymal thickness and appearance, bladder abnormalities, and ureteral abnormalities. Based upon today's images, the renal pelvis AP diameter measurement of 83mm for the left, presence of (normal) renal parenchyma and calyceal architecture with dilation constitute UT dilation as an indication for reassessment; ie, while <68mm at 32 weeks and beyond is normal, this borderline measurement of 22mm is in the context of a prominent calyceal dilation leading me to recommend follow up to rule out evolving clinically significant urinary tract obstruction. Up to 85% of cases identified as measuring at 10mm or more at 18-20 weeks will measure less than 49mm at >32 weeks and require no further follow up. The remainder that progress in dilation and do measure in excess of 30mm of dilation will have follow up arranged by our unit as felt appropriate by the evaluating perinatologist.  I feel follow up in 4-6 weeks rather than at 32 weeks (in 11 days) would be more appropriate to ensure I do not overlook a borderline case that evolves prior to delivery.  Targeted examination of the remaining fetal anatomy was performed and no other dysmorphic features, or morphologic "soft markers" associated with aneuploidy, were detected. When other abnormalities are present there is a significantly increased risk of attendant chromosomal defects, usually trisomy 65. However, in cases of isolated pyelectasis there remains some controversy about the true risk adjustment for trisomy 21, with investigators reporting 0 to 1.5-fold increase  over the background risk.  Recommendations:  Follow up in 4-6 weeks.  If UTD persists and meets criteria, recommend postnatal renal ultrasound and neonatal consultation with pediatric urology.  Time Spent: I spent in excess of 30 minutes in consultation with this patient to review records, evaluate her case, and provide her with an adequate discussion and education.  More than 50% of this time was spent in direct face-to-face counseling. It was a pleasure seeing your patient in the office today.  Thank you for consultation. Please do not hesitate to contact our service for any further questions.    Louann Sjogren Gaynelle Arabian, Louann Sjogren, MD, MS, FACOG Assistant Professor Section of Maternal-Fetal Medicine [facility]

## 2014-07-07 NOTE — Progress Notes (Signed)
MFM consultation, staff note:  Impression:  Urinary Tract Dilation (formerly known as pyelectasis)  Mild urinary tract (UT) dilation, formerly known as pyelectasis, was identified. Isolated UT dilation is observed in about 1 - 2 % of pregnancies at around 18-20 weeks of gestation. Most instances are of no pathological significance. Classification is based upon AP renal pelvic diameter, calyceal dilation, renal parenchymal thickness and appearance, bladder abnormalities, and ureteral abnormalities. Based upon today's images, the renal pelvis AP diameter measurement of 6mm for the left, presence of (normal) renal parenchyma and calyceal architecture with dilation constitute UT dilation as an indication for reassessment; ie, while <7mm at 32 weeks and beyond is normal, this borderline measurement of 6mm is in the context of a prominent calyceal dilation leading me to recommend follow up to rule out evolving clinically significant urinary tract obstruction. Up to 85% of cases identified as measuring at 4mm or more at 18-20 weeks will measure less than 7mm at >32 weeks and require no further follow up. The remainder that progress in dilation and do measure in excess of 7mm of dilation will have follow up arranged by our unit as felt appropriate by the evaluating perinatologist.  I feel follow up in 4-6 weeks rather than at 32 weeks (in 11 days) would be more appropriate to ensure I do not overlook a borderline case that evolves prior to delivery.  Targeted examination of the remaining fetal anatomy was performed and no other dysmorphic features, or morphologic "soft markers" associated with aneuploidy, were detected. When other abnormalities are present there is a significantly increased risk of attendant chromosomal defects, usually trisomy 21. However, in cases of isolated pyelectasis there remains some controversy about the true risk adjustment for trisomy 21, with investigators reporting 0 to 1.5-fold increase  over the background risk.  Recommendations:  Follow up in 4-6 weeks.  If UTD persists and meets criteria, recommend postnatal renal ultrasound and neonatal consultation with pediatric urology.  Time Spent: I spent in excess of 30 minutes in consultation with this patient to review records, evaluate her case, and provide her with an adequate discussion and education.  More than 50% of this time was spent in direct face-to-face counseling. It was a pleasure seeing your patient in the office today.  Thank you for consultation. Please do not hesitate to contact our service for any further questions.    Bertram Haddix Morgan Jamon [place]   Shaya Reddick Morgan, MD, MS, FACOG Assistant Professor Section of Maternal-Fetal Medicine [employer]     

## 2014-07-12 ENCOUNTER — Other Ambulatory Visit (HOSPITAL_COMMUNITY): Payer: Self-pay | Admitting: Obstetrics & Gynecology

## 2014-07-12 ENCOUNTER — Encounter (HOSPITAL_COMMUNITY): Payer: Self-pay | Admitting: Obstetrics & Gynecology

## 2014-08-11 ENCOUNTER — Encounter (HOSPITAL_COMMUNITY): Payer: Self-pay

## 2014-08-11 ENCOUNTER — Ambulatory Visit (HOSPITAL_COMMUNITY)
Admission: RE | Admit: 2014-08-11 | Discharge: 2014-08-11 | Disposition: A | Discharge status: Home / Self Care | Payer: BC Managed Care – PPO | Source: Ambulatory Visit | Attending: Obstetrics & Gynecology | Admitting: Obstetrics & Gynecology

## 2014-08-11 DIAGNOSIS — O358XX0 Maternal care for other (suspected) fetal abnormality and damage, not applicable or unspecified: Secondary | ICD-10-CM | POA: Insufficient documentation

## 2014-08-11 DIAGNOSIS — O35EXX0 Maternal care for other (suspected) fetal abnormality and damage, fetal genitourinary anomalies, not applicable or unspecified: Secondary | ICD-10-CM

## 2014-08-11 DIAGNOSIS — IMO0002 Reserved for concepts with insufficient information to code with codable children: Secondary | ICD-10-CM | POA: Insufficient documentation

## 2014-08-11 DIAGNOSIS — Z0489 Encounter for examination and observation for other specified reasons: Secondary | ICD-10-CM | POA: Insufficient documentation

## 2014-08-11 DIAGNOSIS — Z3A35 35 weeks gestation of pregnancy: Secondary | ICD-10-CM | POA: Insufficient documentation

## 2015-05-12 ENCOUNTER — Encounter (HOSPITAL_COMMUNITY): Payer: Self-pay | Admitting: *Deleted

## 2015-11-27 ENCOUNTER — Emergency Department (HOSPITAL_BASED_OUTPATIENT_CLINIC_OR_DEPARTMENT_OTHER): Payer: BC Managed Care – PPO

## 2015-11-27 ENCOUNTER — Encounter (HOSPITAL_BASED_OUTPATIENT_CLINIC_OR_DEPARTMENT_OTHER): Payer: Self-pay | Admitting: Emergency Medicine

## 2015-11-27 ENCOUNTER — Emergency Department (HOSPITAL_BASED_OUTPATIENT_CLINIC_OR_DEPARTMENT_OTHER)
Admission: EM | Admit: 2015-11-27 | Discharge: 2015-11-27 | Disposition: A | Discharge status: Home / Self Care | Payer: BC Managed Care – PPO | Attending: Emergency Medicine | Admitting: Emergency Medicine

## 2015-11-27 DIAGNOSIS — J45909 Unspecified asthma, uncomplicated: Secondary | ICD-10-CM | POA: Diagnosis not present

## 2015-11-27 DIAGNOSIS — M79605 Pain in left leg: Secondary | ICD-10-CM

## 2015-11-27 HISTORY — DX: Other chronic pain: G89.29

## 2015-11-27 HISTORY — DX: Pain in left ankle and joints of left foot: M25.572

## 2015-11-27 MED ORDER — CEPHALEXIN 500 MG PO CAPS: 500.0000 mg | ORAL_CAPSULE | Freq: Two times a day (BID) | ORAL | 0 refills | Status: AC
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
## 2015-11-27 NOTE — Discharge Instructions (Signed)
You were seen today in the ED for left leg pain. The good news is you do not have a DVT. Likely this pain is musculoskeletal pain after the fall you had. To be on the safe side in case this is an infection, we will send you home with an antibiotic. Take benadryl with this to prevent a rash. You can take Tylenol as needed for pain. Please follow up with your orthopedic doctor. Please return if your pain gets worse, redness gets worse, or you develop fevers.  Take Care!

## 2016-02-29 IMAGING — US US OB FOLLOW-UP
1 series · 12 of 28 positions shown · non-contrast
Comparison: none

[Series 1: us ob follow up · 32 acquisitions, 12 frames shown]
[im 2/32]
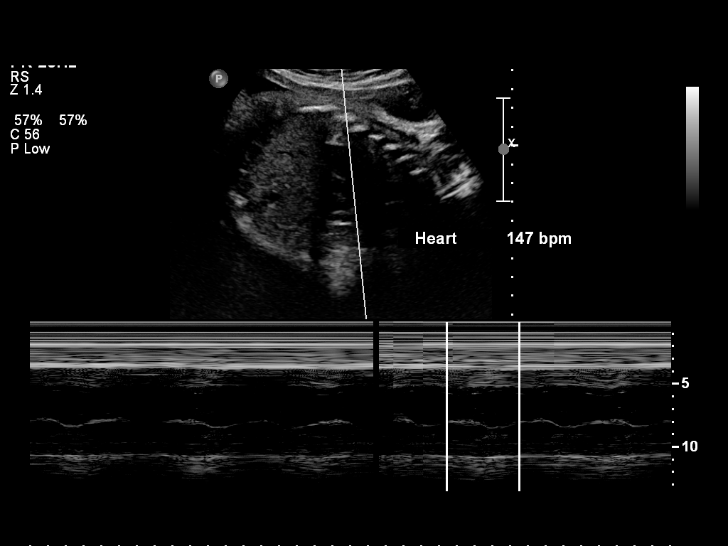
[im 4/32]
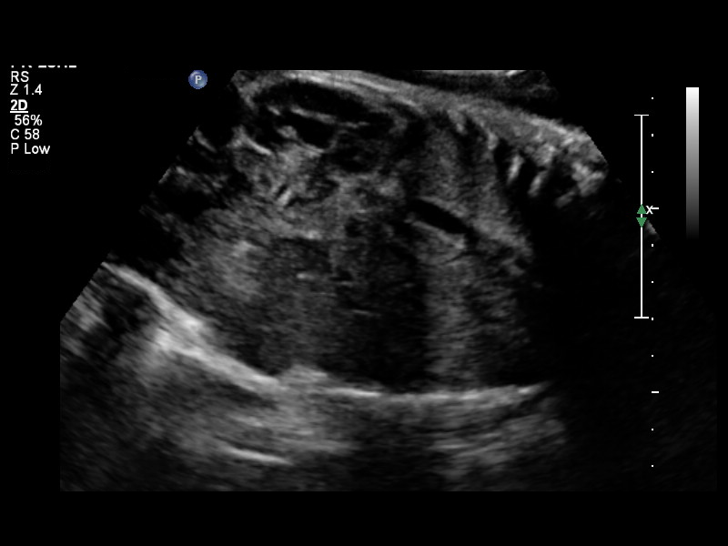
[im 6/32]
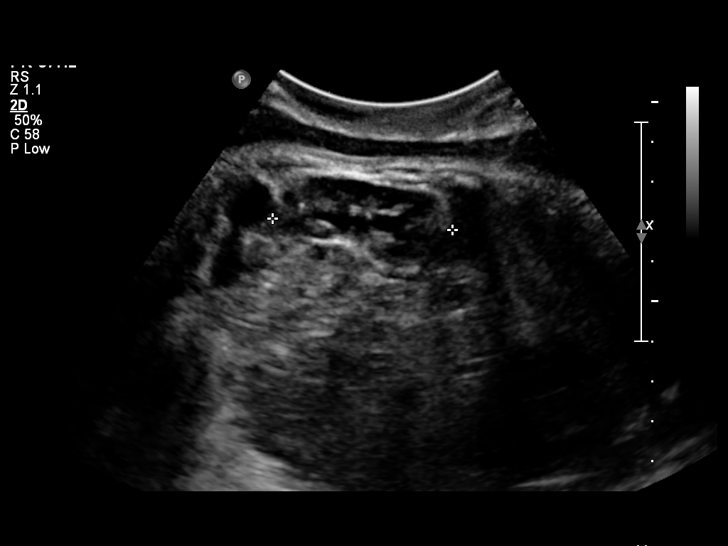
[im 10/32]
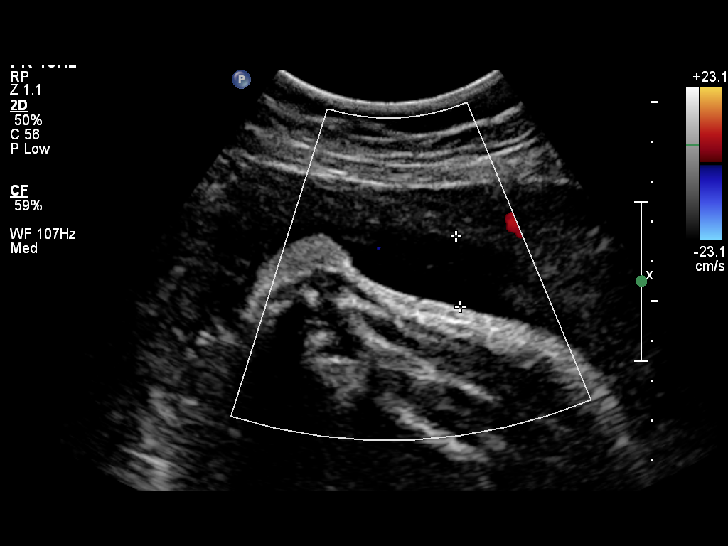
[im 12/32]
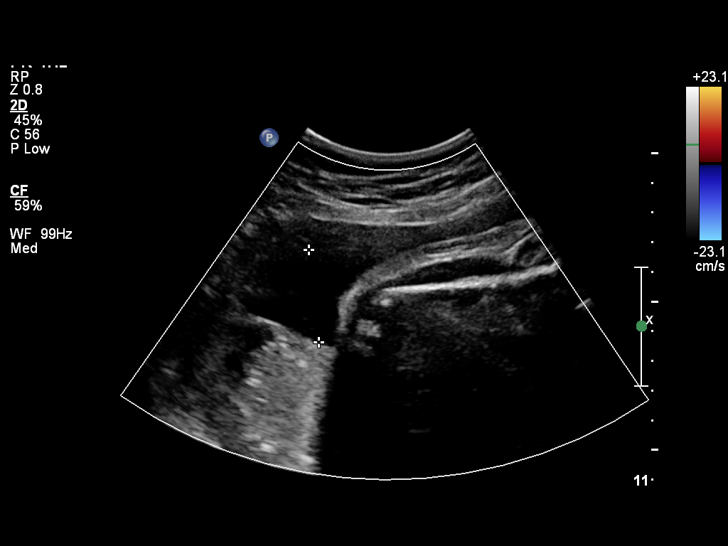
[im 14/32]
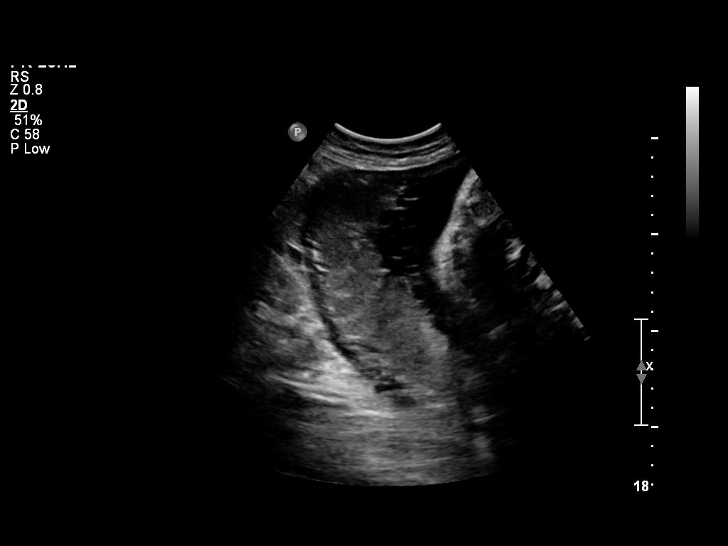
[im 18/32]
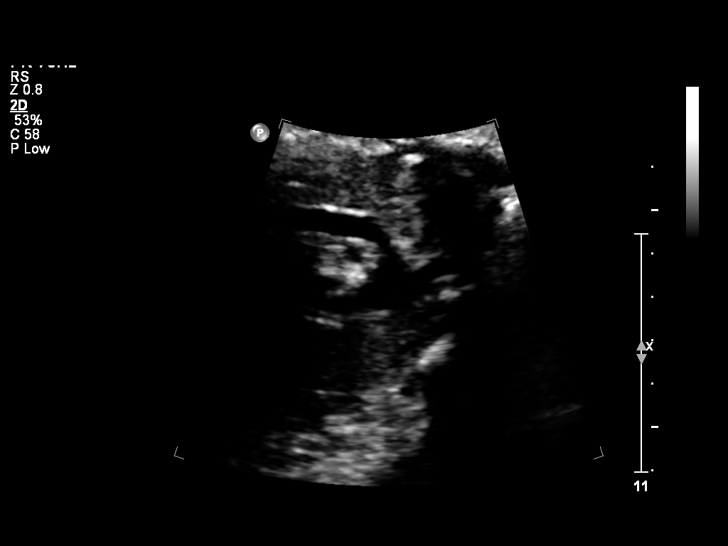
[im 20/32]
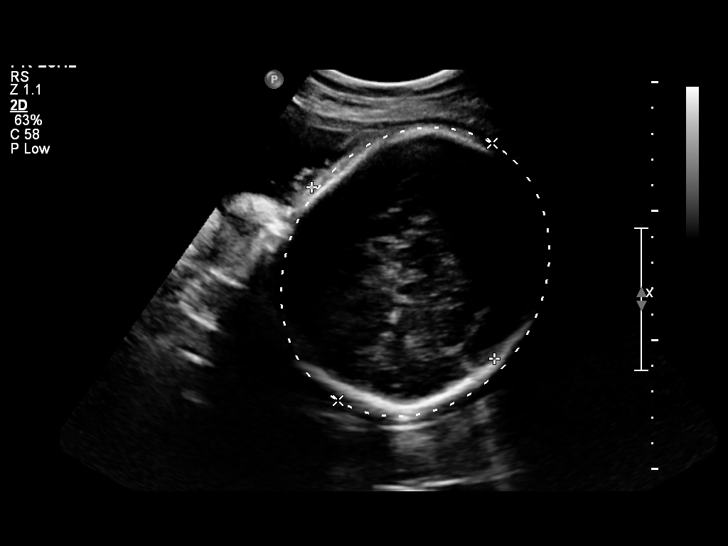
[im 22/32]
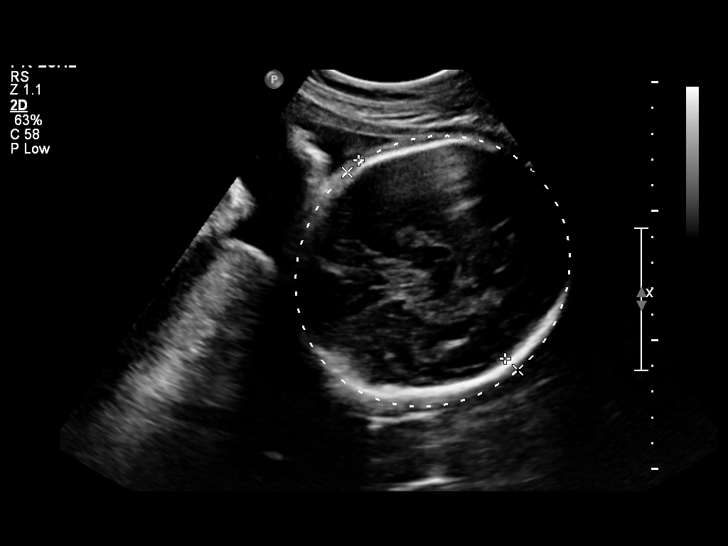
[im 26/32]
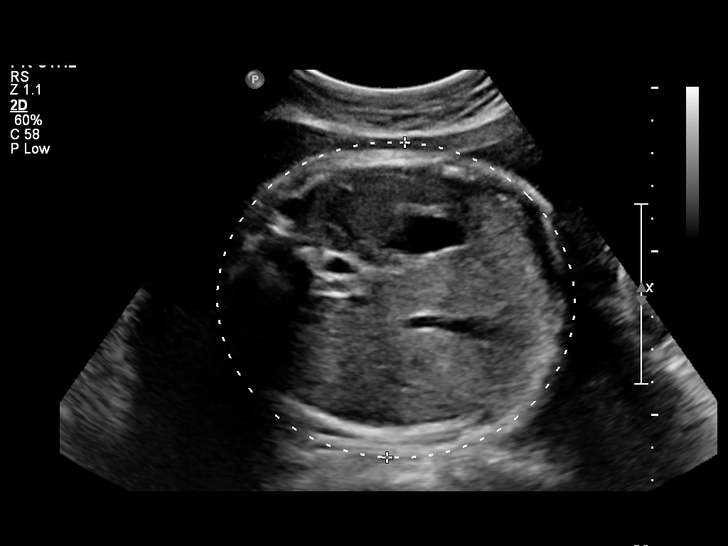
[im 28/32]
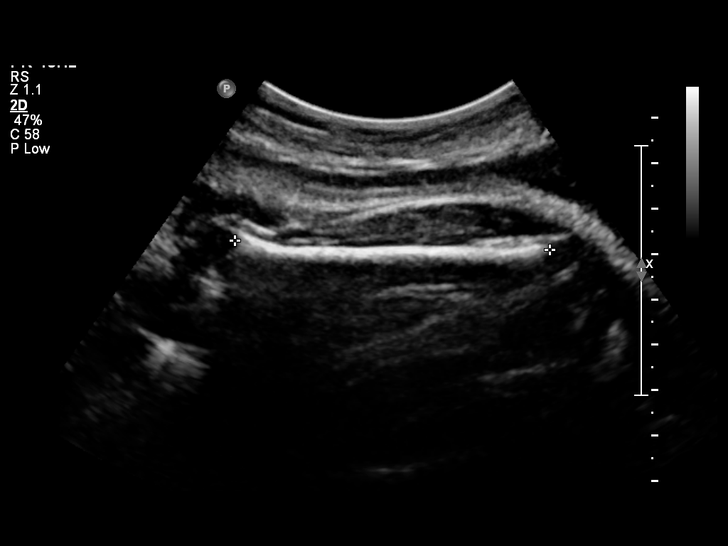
[im 30/32]
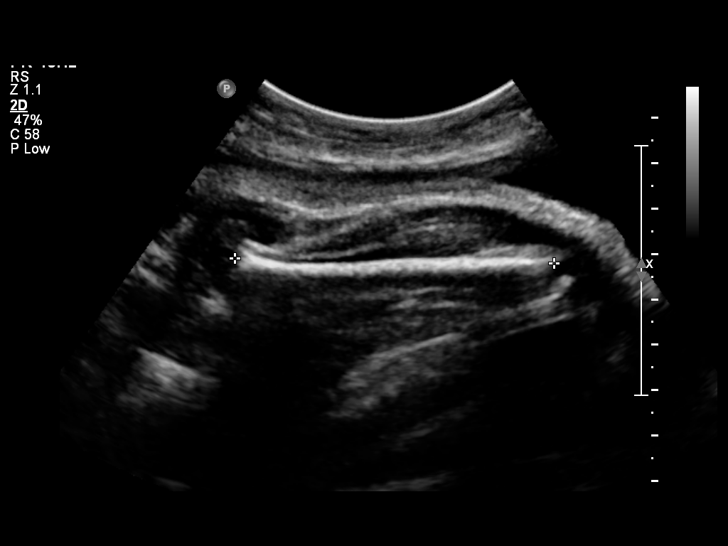

[12 of 28 positions shown; findings below may reference images not displayed]

OBSTETRICS REPORT
(Signed Final 08/11/2014 [DATE])

Name:       JAYAUM MASSAS                         Visit  08/11/2014 [DATE]
Date:

By:
Service(s) Provided

US OB FOLLOW UP                                        76816.1
Indications

Fetal abnormality - other known or suspected
(left hydronephrosis)
35 weeks gestation of pregnancy
Follow-up incomplete fetal anatomic evaluation         Z36
Fetal Evaluation

Num Of             1
Fetuses:
Fetal Heart        147                          bpm
Rate:
Cardiac Activity:  Observed
Presentation:      Cephalic
Placenta:          Posterior, above cervical
os
P. Cord            Previously Visualized
Insertion:

Amniotic Fluid
AFI FV:      Subjectively within normal limits
AFI Sum:     15.87    cm      58  %Tile     Larg Pckt:    6.24   cm
RUQ:   1.76    cm    RLQ:   6.24    cm   LUQ:    4.76    cm   LLQ:    3.11   cm
Biometry

BPD:     95.6   m    G. Age:   39w 0d                 CI:        81.84   70 - 86
m
FL/HC:      20.7   20.1 -
22.3
HC:     333.5   m    G. Age:   38w 1d        80  %    HC/AC:      1.05   0.93 -
m
AC:     319.1   m    G. Age:   35w 6d        69  %    FL/BPD      72.4   71 - 87
m                                     :
FL:      69.2   m    G. Age:   35w 4d        46  %    FL/AC:      21.7   20 - 24
m

Est.        0893   gm    6 lb 7 oz      79   %
FW:
Gestational Age

LMP:           37w 1d        Date:  11/24/13                  EDD:   08/31/14
U/S Today:     37w 1d                                         EDD:   08/31/14
Best:          35w 3d    Det. By:   Early Ultrasound          EDD:   09/12/14
(02/06/14)
Anatomy

Cranium:          Appears normal         Aortic Arch:       Appears normal
Fetal Cavum:      Previously seen        Ductal Arch:       Appears normal
Ventricles:       Appears normal         Diaphragm:         Appears normal
Choroid Plexus:   Previously seen        Stomach:           Appears normal,
left sided
Cerebellum:       Previously seen        Abdomen:           Appears normal
Posterior         Previously seen        Abdominal          Previously seen
Fossa:                                   Wall:
Nuchal Fold:      Not applicable (>20    Cord Vessels:      Previously seen
wks GA)
Face:             Orbits and profile     Kidneys:           Appear normal
previously seen
Lips:             Previously seen        Bladder:           Appears normal
Heart:            Previously seen        Spine:             Previously seen
RVOT:             Previously seen        Lower              Previously seen
Extremities:
LVOT:             Not well visualized    Upper              Previously seen
Extremities:

Other:   Male gender. Heels previously visualized. Nasal bone previously
visualized. Technically difficult due to advanced GA and fetal
position.
Cervix Uterus Adnexa

Cervix:       Not visualized (advanced GA >46wks)
Impression

SIUP at 35+3 weeks
Kidneys: left - renal pelvis measured 5.3 mms which is
WNL for this gestational age; right - normal
All other interval fetal anatomy was seen and appeared
normal
Normal amniotic fluid volume
Appropriate interval growth with EFW at the 79th %tile

---------------------------------------------------------------------- Recommendations

Follow-up as clinically indicated

## 2018-01-25 IMAGING — US US EXTREM LOW VENOUS*L*
1 series · 13 of 24 positions shown · non-contrast
Comparison: None.

CLINICAL DATA: Pain and swelling of the left lower leg. Fell on
11/20/2015. Multiple previous surgeries to the left lower leg.



[Series 1: us extrem low venous*left* · 0.07mm/px · 13 of 28 slices shown]
[im 1/28]
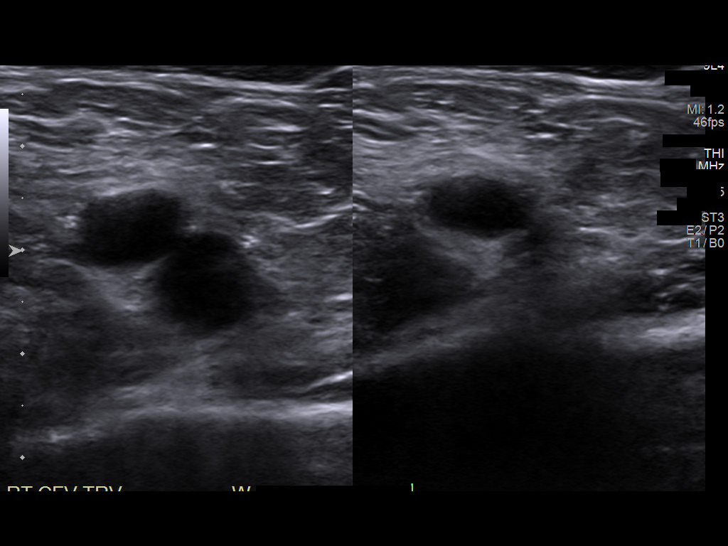
[im 3/28]
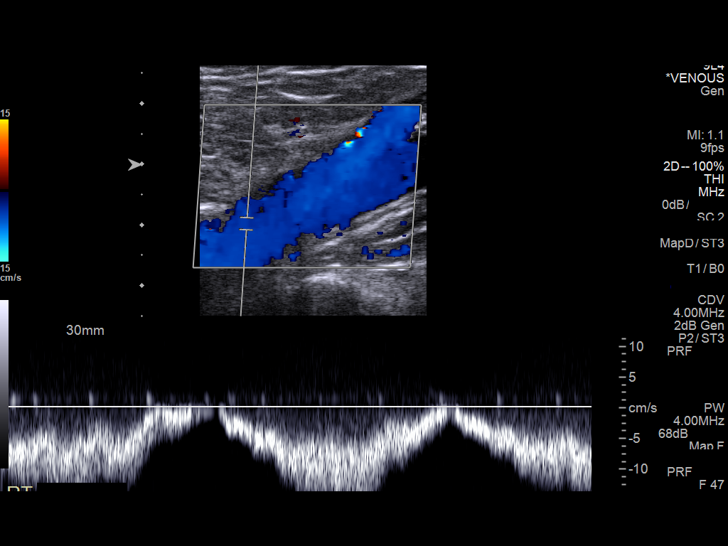
[im 5/28]
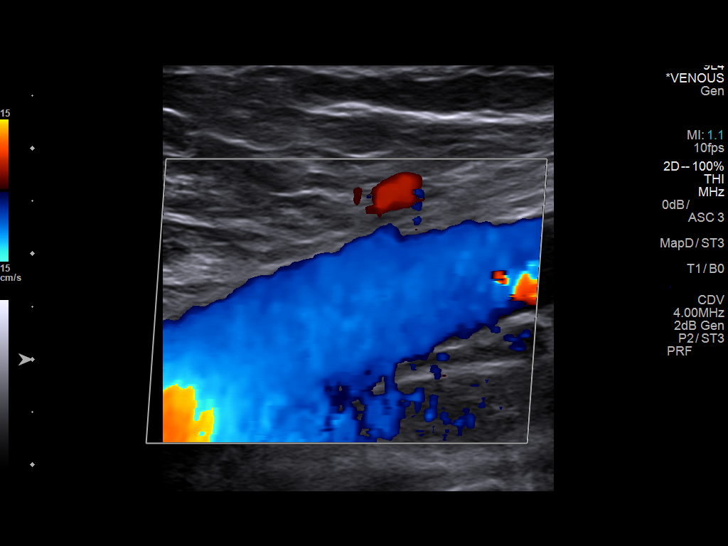
[im 8/28]
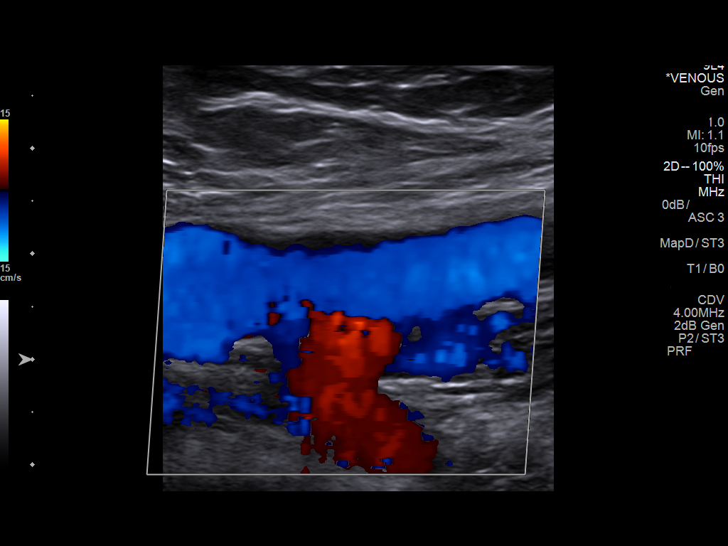
[im 10/28]
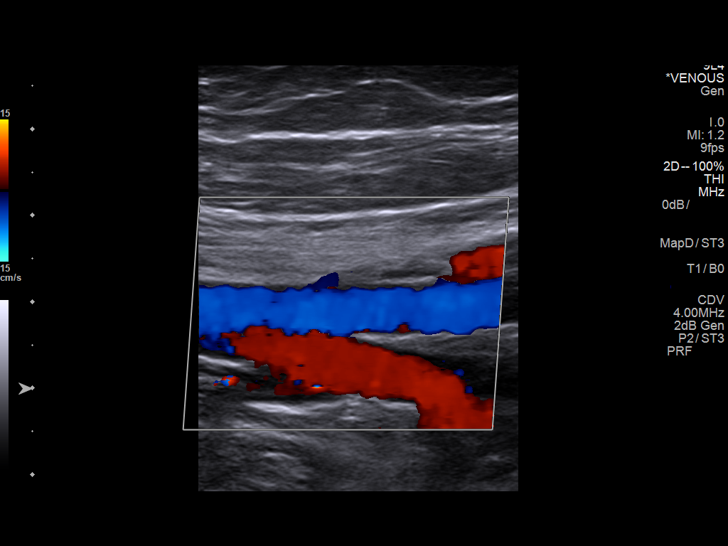
[im 12/28]
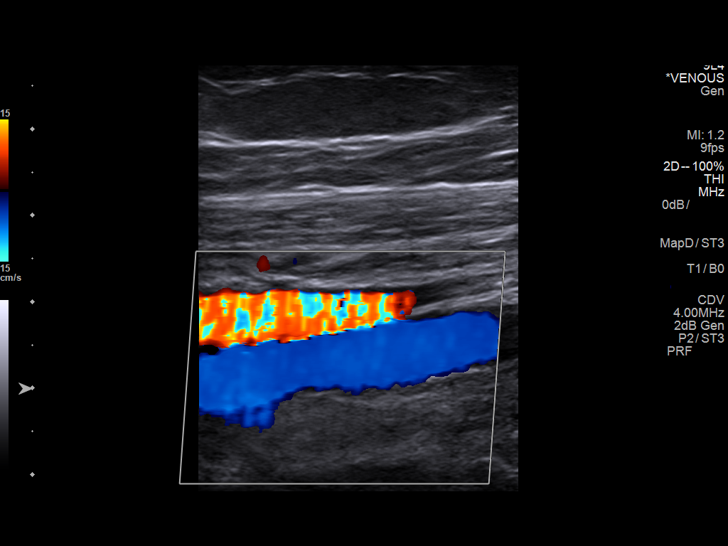
[im 15/28]
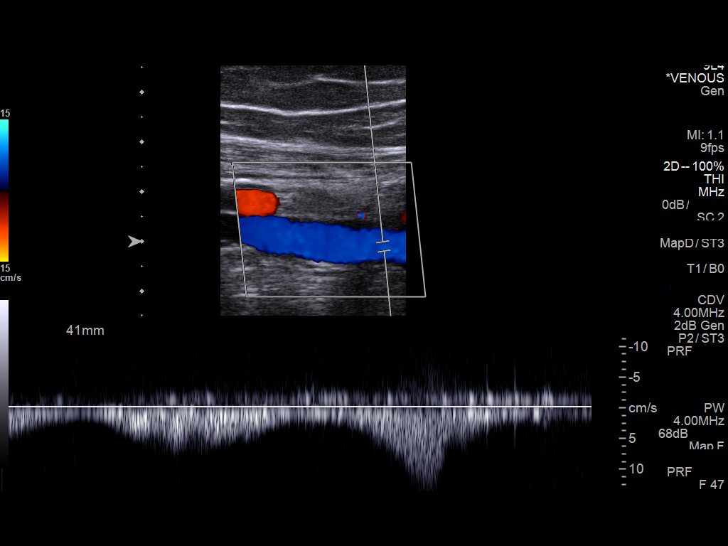
[im 16/28]
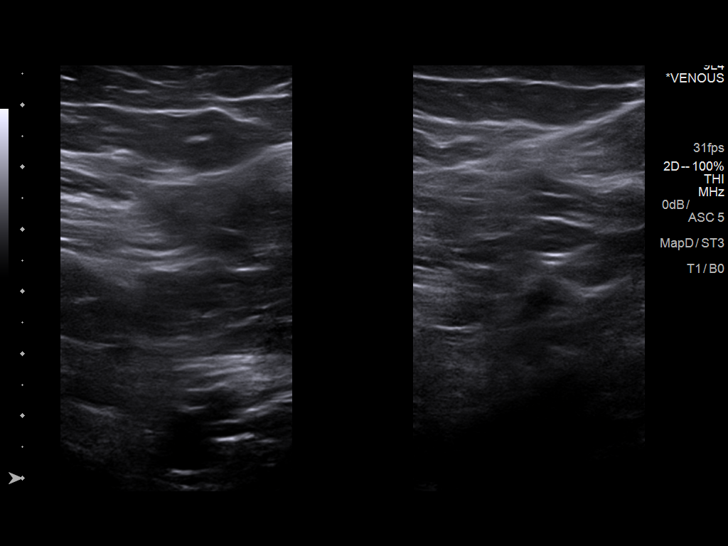
[im 18/28]
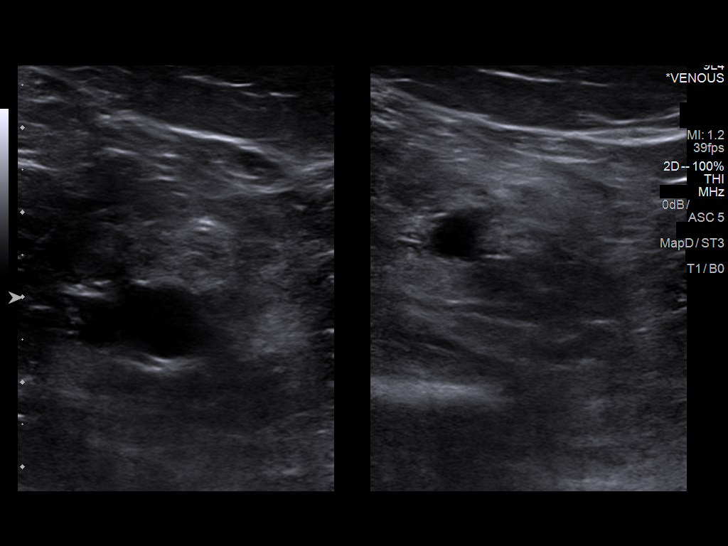
[im 20/28]
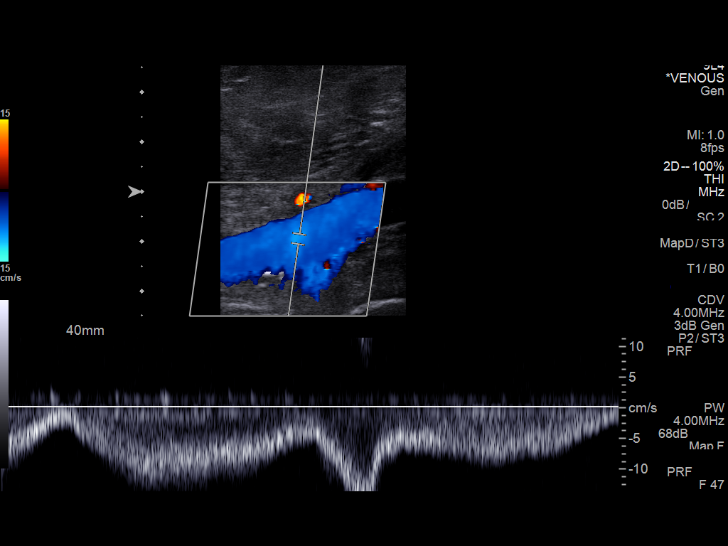
[im 23/28]
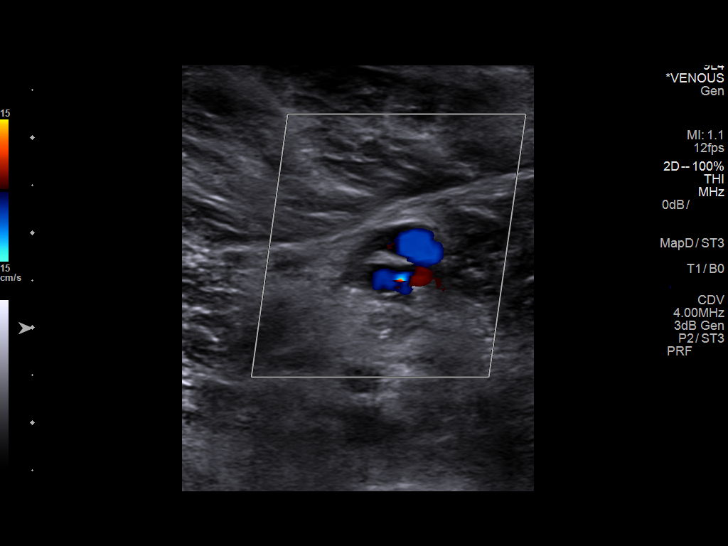
[im 25/28]
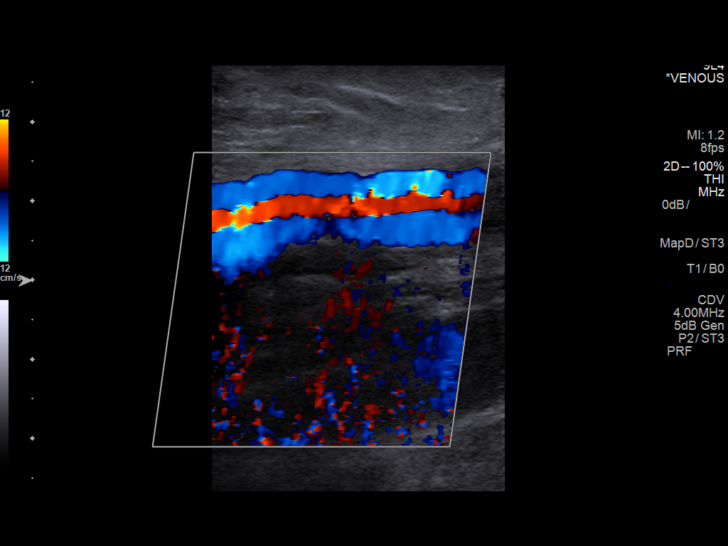
[im 28/28]
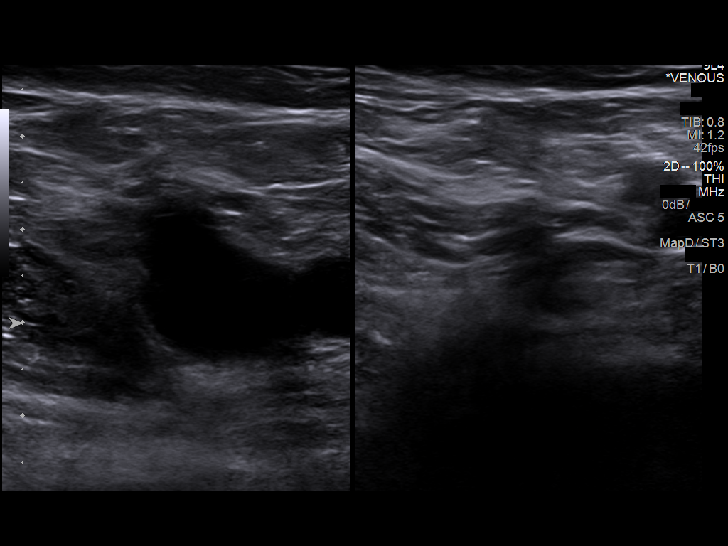

[13 of 24 positions shown; findings below may reference images not displayed]

FINDINGS: Contralateral Common Femoral Vein: Respiratory phasicity is normal
and symmetric with the symptomatic side. No evidence of thrombus.
Normal compressibility.

Common Femoral Vein: No evidence of thrombus. Normal
compressibility, respiratory phasicity and response to augmentation.

Saphenofemoral Junction: No evidence of thrombus. Normal
compressibility and flow on color Doppler imaging.

Profunda Femoral Vein: No evidence of thrombus. Normal
compressibility and flow on color Doppler imaging.

Femoral Vein: No evidence of thrombus. Normal compressibility,
respiratory phasicity and response to augmentation.

Popliteal Vein: No evidence of thrombus. Normal compressibility,
respiratory phasicity and response to augmentation.

Calf Veins: No evidence of thrombus. Normal compressibility and flow
on color Doppler imaging.

Superficial Great Saphenous Vein: No evidence of thrombus. Normal
compressibility and flow on color Doppler imaging.

Venous Reflux:  None.

Other Findings: Soft tissue edema demonstrated in the subcutaneous
tissues of the left ankle and lower calf region. No loculated
collections.
IMPRESSION: No evidence of deep venous thrombosis.
